# Patient Record
Sex: Female | Born: 1955 | Race: White | Hispanic: No | Marital: Single | Smoking: Never smoker
Health system: Southern US, Community
[De-identification: ages and names within clinical notes are randomized; demographics above are authoritative.]

## PROBLEM LIST (undated history)

## (undated) DIAGNOSIS — Z95810 Presence of automatic (implantable) cardiac defibrillator: Secondary | ICD-10-CM

## (undated) DIAGNOSIS — C801 Malignant (primary) neoplasm, unspecified: Secondary | ICD-10-CM

## (undated) DIAGNOSIS — I10 Essential (primary) hypertension: Secondary | ICD-10-CM

## (undated) DIAGNOSIS — T7840XA Allergy, unspecified, initial encounter: Secondary | ICD-10-CM

## (undated) DIAGNOSIS — C4491 Basal cell carcinoma of skin, unspecified: Secondary | ICD-10-CM

## (undated) DIAGNOSIS — I509 Heart failure, unspecified: Secondary | ICD-10-CM

## (undated) DIAGNOSIS — I429 Cardiomyopathy, unspecified: Secondary | ICD-10-CM

## (undated) HISTORY — DX: Cardiomyopathy, unspecified: I42.9

## (undated) HISTORY — DX: Essential (primary) hypertension: I10

## (undated) HISTORY — DX: Allergy, unspecified, initial encounter: T78.40XA

## (undated) HISTORY — PX: APPENDECTOMY: SHX54

## (undated) HISTORY — PX: BREAST BIOPSY: SHX20

## (undated) HISTORY — DX: Presence of automatic (implantable) cardiac defibrillator: Z95.810

## (undated) HISTORY — DX: Heart failure, unspecified: I50.9

---

## 1998-03-16 DIAGNOSIS — M629 Disorder of muscle, unspecified: Secondary | ICD-10-CM | POA: Insufficient documentation

## 2003-04-11 DIAGNOSIS — M810 Age-related osteoporosis without current pathological fracture: Secondary | ICD-10-CM | POA: Insufficient documentation

## 2006-10-29 ENCOUNTER — Other Ambulatory Visit: Payer: Self-pay

## 2006-10-29 ENCOUNTER — Emergency Department: Payer: Self-pay | Admitting: Emergency Medicine

## 2006-11-24 ENCOUNTER — Ambulatory Visit: Payer: Self-pay | Admitting: Cardiology

## 2006-12-15 ENCOUNTER — Ambulatory Visit: Payer: Self-pay | Admitting: Internal Medicine

## 2007-01-15 ENCOUNTER — Emergency Department: Payer: Self-pay | Admitting: Emergency Medicine

## 2007-01-25 ENCOUNTER — Ambulatory Visit: Payer: Self-pay | Admitting: Internal Medicine

## 2007-02-04 ENCOUNTER — Ambulatory Visit: Payer: Self-pay

## 2007-02-08 ENCOUNTER — Ambulatory Visit: Payer: Self-pay | Admitting: Internal Medicine

## 2007-02-08 ENCOUNTER — Observation Stay (HOSPITAL_COMMUNITY): Admission: AD | Admit: 2007-02-08 | Discharge: 2007-02-10 | Payer: Self-pay | Admitting: Internal Medicine

## 2007-02-08 ENCOUNTER — Ambulatory Visit: Payer: Self-pay | Admitting: Cardiology

## 2007-02-09 ENCOUNTER — Encounter: Payer: Self-pay | Admitting: Internal Medicine

## 2007-02-24 ENCOUNTER — Ambulatory Visit: Payer: Self-pay | Admitting: Internal Medicine

## 2007-06-16 ENCOUNTER — Ambulatory Visit: Payer: Self-pay | Admitting: Internal Medicine

## 2007-09-20 ENCOUNTER — Ambulatory Visit: Payer: Self-pay | Admitting: Internal Medicine

## 2008-03-05 ENCOUNTER — Ambulatory Visit: Payer: Self-pay | Admitting: Internal Medicine

## 2008-06-05 ENCOUNTER — Ambulatory Visit: Payer: Self-pay | Admitting: Internal Medicine

## 2008-06-12 ENCOUNTER — Encounter: Payer: Self-pay | Admitting: Internal Medicine

## 2008-09-04 ENCOUNTER — Ambulatory Visit: Payer: Self-pay | Admitting: Internal Medicine

## 2008-09-27 ENCOUNTER — Encounter: Payer: Self-pay | Admitting: Internal Medicine

## 2008-12-11 ENCOUNTER — Ambulatory Visit: Payer: Self-pay | Admitting: Internal Medicine

## 2009-01-02 DIAGNOSIS — I509 Heart failure, unspecified: Secondary | ICD-10-CM | POA: Insufficient documentation

## 2009-01-02 DIAGNOSIS — I429 Cardiomyopathy, unspecified: Secondary | ICD-10-CM | POA: Insufficient documentation

## 2009-01-03 ENCOUNTER — Encounter: Payer: Self-pay | Admitting: Internal Medicine

## 2009-03-18 ENCOUNTER — Ambulatory Visit: Payer: Self-pay | Admitting: Internal Medicine

## 2009-06-18 ENCOUNTER — Ambulatory Visit: Payer: Self-pay | Admitting: Internal Medicine

## 2009-07-03 ENCOUNTER — Encounter: Payer: Self-pay | Admitting: Internal Medicine

## 2009-09-19 ENCOUNTER — Ambulatory Visit: Payer: Self-pay | Admitting: Internal Medicine

## 2009-10-02 ENCOUNTER — Encounter: Payer: Self-pay | Admitting: Internal Medicine

## 2009-12-05 ENCOUNTER — Ambulatory Visit: Payer: Self-pay | Admitting: Gastroenterology

## 2009-12-09 LAB — PATHOLOGY REPORT

## 2009-12-19 ENCOUNTER — Ambulatory Visit: Payer: Self-pay | Admitting: Internal Medicine

## 2010-02-13 ENCOUNTER — Encounter (INDEPENDENT_AMBULATORY_CARE_PROVIDER_SITE_OTHER): Payer: Self-pay | Admitting: *Deleted

## 2010-03-06 ENCOUNTER — Telehealth (INDEPENDENT_AMBULATORY_CARE_PROVIDER_SITE_OTHER): Payer: Self-pay | Admitting: *Deleted

## 2010-04-15 NOTE — Cardiovascular Report (Signed)
Summary: Office Visit Remote  Office Visit Remote   Imported By: Roderic Ovens 07/03/2009 14:39:18  _____________________________________________________________________  External Attachment:    Type:   Image     Comment:   External Document

## 2010-04-15 NOTE — Letter (Signed)
Summary: Remote Device Check  [employer], Main Office  [address]   [place], [place] [address]   Phone: [phone]  Fax: [phone]     October 02, 2009 MRN: [id number]   [facility] [place] [address] [place], [place]  [address]   Dear Ms. Wenner,   Your remote transmission was recieved and reviewed by your physician.  All diagnostics were within normal limits for you.  __X___Your next transmission is scheduled for:   12-19-2009.  Please transmit at any time this day.  If you have a wireless device your transmission will be sent automatically.   Sincerely,  Vella Kohler

## 2010-04-15 NOTE — Assessment & Plan Note (Signed)
Summary: f1y   Visit Type:  Follow-up Primary Provider:  DR. Blythe Stanford  CC:  No complaints and some lightheadedness within the last week.  History of Present Illness: Vanessa Cohen is seen in followup for congestive heart failure in the setting of nonischemic cardiomyopathy he is status post CRT D. implantation.The patient denies SOB, chest pain, edema or palpitations   Current Medications (verified): 1)  Furosemide 20 Mg Tabs (Furosemide) .... Take One Tablet By Mouth Daily. 2)  Aspirin 81 Mg Tbec (Aspirin) .... Take One Tablet By Mouth Daily 3)  Ramipril 5 Mg Caps (Ramipril) .... Take One Capsule By Mouth Daily 4)  Carvedilol 25 Mg Tabs (Carvedilol) .... Take One Tablet By Mouth Twice A Day 5)  Zyrtec Hives Relief 10 Mg Tabs (Cetirizine Hcl) .Marland Kitchen.. 1 By Mouth Once Daily  Allergies (verified): No Known Drug Allergies  Vital Signs:  Patient profile:   55 year old female Height:      68 inches Weight:      162 pounds BMI:     24.72 Pulse rate:   62 / minute Pulse rhythm:   regular BP sitting:   128 / 98  (left arm) Cuff size:   regular  Vitals Entered By: Mercer Pod (March 18, 2009 10:05 AM)  Physical Exam  General:  The patient was alert and oriented in no acute distress. HEENT Normal.  Neck veins were flat, carotids were brisk.  Lungs were clear.  Heart sounds were regular without murmurs or gallops.  Abdomen was soft with active bowel sounds. There is no clubbing cyanosis or edema. Skin Warm and dry     ICD Specifications Following MD:  Sherryl Manges, MD     ICD Vendor:  Medtronic     ICD Model Number:  [id number]     ICD Serial Number:  [id number] ICD DOI:  02/08/2007     ICD Implanting MD:  Sherryl Manges, MD  Lead 1:    Location: RA     DOI: 02/08/2007     Model #: 1478     Serial #: [id number]     Status: active Lead 2:    Location: RV     DOI: 02/08/2007     Model #: 5784     Serial #: [id number]     Status: active Lead 3:    Location: LV     DOI:  02/08/2007     Model #: 1324     Serial #: [id number]     Status: active  Indications::  NICM, CHF   ICD Follow Up Remote Check?  No Battery Voltage:  3.10 V     Charge Time:  9.6 seconds     Underlying rhythm:  SR@63  ICD Dependent:  No       ICD Device Measurements Atrium:  Amplitude: 5.0 mV, Impedance: 520 ohms, Threshold: 0.5 V at 0.4 msec Right Ventricle:  Amplitude: 9.0 mV, Impedance: 376 ohms, Threshold: 0.5 V at 0.4 msec Left Ventricle:  Impedance: 384 ohms, Threshold: 0.5 V at 0.4 msec Shock Impedance: 48/59 ohms   Episodes MS Episodes:  15     Percent Mode Switch:  <0.1%     Coumadin:  No Shock:  0     ATP:  0     Nonsustained:  0     Atrial Pacing:  0.7%     Ventricular Pacing:  99.8%  Brady Parameters Mode DDD     Lower Rate Limit:  50  Upper Rate Limit 130 PAV 120     Sensed AV Delay:  120  Tachy Zones VF:  207     VT:  182     Next Remote Date:  06/18/2009     Next Cardiology Appt Due:  03/16/2010 Tech Comments:  No parameter changes.  Device function normal.   [facility] transmissions every 3 months.  ROV 1 year Dr. Herbert Moors. Altha Harm, LPN  March 18, 2009 10:15 AM   Impression & Recommendations:  Problem # 1:  CONGESTIVE HEART FAILURE, UNSPECIFIED (ICD-428.0) She is well compensated on her current medications. Based on the recent Aldactone trial, I think it would be reasonable to begin her on an aldosterone antagonist. I will defer this to her primary cardiologist I have given her a note Her updated medication list for this problem includes:    Furosemide 20 Mg Tabs (Furosemide) .Marland Kitchen... Take one tablet by mouth daily.    Aspirin 81 Mg Tbec (Aspirin) .Marland Kitchen... Take one tablet by mouth daily    Ramipril 5 Mg Caps (Ramipril) .Marland Kitchen... Take one capsule by mouth daily    Carvedilol 25 Mg Tabs (Carvedilol) .Marland Kitchen... Take one tablet by mouth twice a day  Problem # 2:  ICD - IN SITU (ICD-V45.02) CRT device is functioning normally there were brief episodes of atrial  fibrillation which is likely related far field oversensing  Problem # 3:  CARDIOMYOPATHY, SECONDARY (ICD-425.9) continue her current medications otherwise as above Her updated medication list for this problem includes:    Furosemide 20 Mg Tabs (Furosemide) .Marland Kitchen... Take one tablet by mouth daily.    Aspirin 81 Mg Tbec (Aspirin) .Marland Kitchen... Take one tablet by mouth daily    Ramipril 5 Mg Caps (Ramipril) .Marland Kitchen... Take one capsule by mouth daily    Carvedilol 25 Mg Tabs (Carvedilol) .Marland Kitchen... Take one tablet by mouth twice a day  Patient Instructions: 1)  Your physician recommends that you schedule a follow-up appointment in: 1 year

## 2010-04-15 NOTE — Cardiovascular Report (Signed)
Summary: Office Visit Remote   Office Visit Remote   Imported By: Roderic Ovens 10/03/2009 10:56:37  _____________________________________________________________________  External Attachment:    Type:   Image     Comment:   External Document

## 2010-04-15 NOTE — Letter (Signed)
Summary: Remote Device Check  [employer], Main Office  [address]   [place], [place] [address]   Phone: [phone]  Fax: [phone]     July 03, 2009 MRN: [id number]   [facility] [place] [address] [place], [place]  [address]   Dear Ms. Yeley,   Your remote transmission was recieved and reviewed by your physician.  All diagnostics were within normal limits for you.  __X___Your next transmission is scheduled for: September 19, 2009.  Please transmit at any time this day.  If you have a wireless device your transmission will be sent automatically.     Sincerely,  Proofreader

## 2010-04-15 NOTE — Letter (Signed)
Summary: Remote Device Check  [employer], Main Office  [address]   [place], [place] [address]   Phone: [phone]  Fax: [phone]     February 13, 2010 MRN: [id number]   [facility] [place] [address] [place], [place]  [address]   Dear Ms. Davtyan,   Your remote transmission was recieved and reviewed by your physician.  All diagnostics were within normal limits for you.  _____Your next transmission is scheduled for:                       .  Please transmit at any time this day.  If you have a wireless device your transmission will be sent automatically.  __X____Your next office visit is scheduled for: January 2012 with Dr. Graciela Husbands.                            Marland Kitchen Please call our office to schedule an appointment.    Sincerely,  Altha Harm, LPN

## 2010-04-15 NOTE — Miscellaneous (Signed)
Summary: med update  Clinical Lists Changes  Medications: Added new medication of FUROSEMIDE 20 MG TABS (FUROSEMIDE) Take one tablet by mouth daily. Added new medication of ASPIRIN 81 MG TBEC (ASPIRIN) Take one tablet by mouth daily Added new medication of RAMIPRIL 5 MG CAPS (RAMIPRIL) Take one capsule by mouth daily Added new medication of CARVEDILOL 25 MG TABS (CARVEDILOL) Take one tablet by mouth twice a day

## 2010-04-17 NOTE — Progress Notes (Signed)
Summary: Called pt  Phone Note Outgoing Call Call back at [facility] Phone [phone]   Call placed by: Harlon Flor,  March 06, 2010 11:57 AM Call placed to: Patient Summary of Call: [facility] TCB to schedule f/u with Graciela Husbands in January. Initial call taken by: Harlon Flor,  March 06, 2010 11:57 AM

## 2010-05-09 ENCOUNTER — Encounter (INDEPENDENT_AMBULATORY_CARE_PROVIDER_SITE_OTHER): Payer: BC Managed Care – PPO | Admitting: Internal Medicine

## 2010-05-09 ENCOUNTER — Encounter: Payer: Self-pay | Admitting: Internal Medicine

## 2010-05-09 DIAGNOSIS — I4891 Unspecified atrial fibrillation: Secondary | ICD-10-CM

## 2010-05-09 DIAGNOSIS — I472 Ventricular tachycardia: Secondary | ICD-10-CM

## 2010-05-09 DIAGNOSIS — I428 Other cardiomyopathies: Secondary | ICD-10-CM

## 2010-05-09 DIAGNOSIS — Z95810 Presence of automatic (implantable) cardiac defibrillator: Secondary | ICD-10-CM

## 2010-05-13 NOTE — Assessment & Plan Note (Signed)
Summary: F1Y/JLA   Visit Type:  Follow-up Primary Provider:  DR. Blythe Stanford  CC:  "Doing well.".  History of Present Illness: Vanessa Cohen is seen in followup for congestive heart failure in the setting of nonischemic cardiomyopathy he is status post CRT D. implantation.The patient denies SOB, chest pain, edema or palpitations  She is actually doing cardio workouts 3 or 4 times a week now. She is able to climb stairs. She had an echo done last week. These results are pending.  Her other major issue is intermittent orthostasis.  Current Medications (verified): 1)  Furosemide 20 Mg Tabs (Furosemide) .... Take One Tablet By Mouth Daily. 2)  Aspirin 81 Mg Tbec (Aspirin) .... Take One Tablet By Mouth Daily 3)  Ramipril 10 Mg Caps (Ramipril) .... One Tablet Once Daily 4)  Carvedilol 25 Mg Tabs (Carvedilol) .... Take One Tablet By Mouth Twice A Day 5)  Zyrtec Hives Relief 10 Mg Tabs (Cetirizine Hcl) .Marland Kitchen.. 1 By Mouth Once Daily 6)  Flonase 50 Mcg/act Susp (Fluticasone Propionate) .... As Needed 7)  Vitamin C 1000mg  .... Daily  Allergies (verified): No Known Drug Allergies  Past History:  Past Medical History: Last updated: 01/02/2009 CONGESTIVE HEART FAILURE, UNSPECIFIED (ICD-428.0) CARDIOMYOPATHY, SECONDARY (ICD-425.9) ICD - IN SITU (ICD-V45.02)    Family History: Last updated: 01/02/2009 Family History of CVA or Stroke:   Social History: Last updated: 01/02/2009 Full Time Divorced  Tobacco Use - No.  Alcohol Use - yes Regular Exercise - no Drug Use - no  Risk Factors: Exercise: no (01/02/2009)  Risk Factors: Smoking Status: never (01/02/2009)  Vital Signs:  Patient profile:   55 year old female Height:      68 inches Weight:      141 pounds BMI:     21.52 Pulse rate:   76 / minute BP sitting:   90 / 60  (left arm) Cuff size:   regular  Vitals Entered By: Bishop Dublin, CMA (May 09, 2010 10:49 AM)  Physical Exam  General:  The patient was alert  and oriented in no acute distress. HEENT Normal.  Neck veins were flat device pocket is well-healed Lungs were clear.  Heart sounds were regular without murmurs or gallops.  Abdomen was soft with active bowel sounds. There is no clubbing cyanosis or edema. Skin Warm and dry     ICD Specifications Following MD:  Sherryl Manges, MD     ICD Vendor:  Medtronic     ICD Model Number:  [phone]     ICD Serial Number:  [id number] ICD DOI:  02/08/2007     ICD Implanting MD:  Sherryl Manges, MD  Lead 1:    Location: RA     DOI: 02/08/2007     Model #: 1478     Serial #: [id number]     Status: active Lead 2:    Location: RV     DOI: 02/08/2007     Model #: 5784     Serial #: [id number]     Status: active Lead 3:    Location: LV     DOI: 02/08/2007     Model #: 1324     Serial #: [id number]     Status: active  Indications::  NICM, CHF   ICD Follow Up ICD Dependent:  No      Episodes Coumadin:  No  Brady Parameters Mode DDD     Lower Rate Limit:  50     Upper Rate Limit 130 PAV 120  Sensed AV Delay:  120  Tachy Zones VF:  207     VT:  182     Impression & Recommendations:  Problem # 1:  CONGESTIVE HEART FAILURE, UNSPECIFIED (ICD-428.0) her congestive symptoms are largely resolved. I await with eagerness the status of her left ventricular function by echo. She'll continue with CRT Her updated medication list for this problem includes:    Furosemide 20 Mg Tabs (Furosemide) .Marland Kitchen... Take one tablet by mouth daily.    Aspirin 81 Mg Tbec (Aspirin) .Marland Kitchen... Take one tablet by mouth daily    Ramipril 10 Mg Caps (Ramipril) ..... One tablet once daily    Carvedilol 25 Mg Tabs (Carvedilol) .Marland Kitchen... 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 This was about 8 seconds. In addition his other episodes by [facility], who make sure that her  detection intervals are sufficiently prolonged as to avoid premature therapy Her updated medication list for this problem includes:    Aspirin 81 Mg Tbec (Aspirin) .Marland Kitchen... Take one tablet by mouth daily    Ramipril 10 Mg Caps (Ramipril) ..... One tablet once daily    Carvedilol 25 Mg Tabs (Carvedilol) .Marland Kitchen... Take one tablet by mouth twice a day  Problem # 4:  ATRIAL FIBRILLATION-NONSUSTAINED (ICD-427.31) 10 she is nonsustained atrial arrhythmias; we will keep an eye on this. There is no indication at this point for anticoagulation Her updated medication list for this problem includes:    Aspirin 81 Mg Tbec (Aspirin) .Marland Kitchen... Take one tablet by mouth daily    Carvedilol 25 Mg Tabs (Carvedilol) .Marland Kitchen... Take one tablet by mouth twice a day  Problem # 5:  CARDIOMYOPATHY, SECONDARY (ICD-425.9) stable on current meds Her updated medication list for this problem includes:    Furosemide 20 Mg Tabs (Furosemide) .Marland Kitchen... Take one tablet by mouth daily.    Aspirin 81 Mg Tbec (Aspirin) .Marland Kitchen... Take one tablet by mouth daily    Ramipril 10 Mg Caps (Ramipril) ..... One tablet once daily    Carvedilol 25 Mg Tabs (Carvedilol) .Marland Kitchen... Take one tablet by mouth twice a day

## 2010-07-29 NOTE — Letter (Signed)
June 16, 2007    Dr. Trinna Post [facility]aschos  [address]  [place], [place] [address]   RE:  Vanessa Cohen, Vanessa Cohen  MRN:  [id number]  /  DOB:  05-Apr-1955   Dear Trinna Post:   Vanessa Cohen comes in today following CRT implantation in November  2008.  She has moved from class III to class II symptoms.  She has had a  little bit of diaphragmatic stimulation that is somewhat positional.   Otherwise, she is doing quite as noted.   MEDICATIONS:  1. Coreg now at 25 b.i.d.  2. Ramipril 5 a day.  3. Aspirin.  4. Furosemide 20.   EXAMINATION:  Her blood pressure is 142/70, which is a little bit high.  LUNGS:  Were clear.  HEART:  Sounds were regular.  EXTREMITIES:  Were without edema.   Interrogation of her Medtronic __________ device demonstrated a P wave  of 5.1, impedance of 600, threshold of 0.5 to 0.4.  The R wave was 6.6  with impedance of 416, and threshold of 1 volt to 0.4.  The LV impedance  was 316 with a threshold of 0.5 and 0.4.  Her A-V delay was programmed  at 140.  Her sensed A-V delay was 150 to 160, and we reprogrammed this  down to 120 with a dynamic A-V delay of 1.   IMPRESSION:  1. Ischemic cardiomyopathy.  2. Congestive heart failure __________ with borderline elevated blood      pressure.   Vanessa Cohen is doing very well.  We talked a lot about the longterm  prognosis associated with device therapy, and the improvements therein  associated with __________ therapy with doses of Coreg and Ramipril.   We will plan to continue her on these same medications.  I have advised  her to keep close track of her blood pressure, maybe she needs up  titration.   We will see her again in 9 months' time, and she will begin remote  followup in the interim.   Alex, I hope you and your family are doing well.    Sincerely,      Duke Salvia, MD, [facility]  Electronically Signed    SCK/MedQ  DD: 06/16/2007  DT: 06/16/2007  Job #: [id number]

## 2010-07-29 NOTE — Letter (Signed)
March 05, 2008    Dr. Trinna Post Paraschos  [address]  [place], [place] [address]   RE:  Vanessa Cohen, Vanessa Cohen  MRN:  [id number]  /  DOB:  1955/06/20   Dear Trinna Post:   I hope this letter finds you well and my wishes to you for a Merry  Christmas.  Hopefully Christmas will be very special to you this year.   [name] comes in.  She continues to do really quite well from a  breathing point of view.  Her functional capacity remains good.  She is  able to accomplish her activities of daily living.  She is having no  orthopnea, nocturnal dyspnea or peripheral edema.  She did have an  episode of chest discomfort that was unrelated to exertion.   Her medications are stable and include Coreg 25 b.i.d., furosemide 20,  aspirin 81 and ramipril 5.   EXAMINATION:  VITAL SIGNS:  Her blood pressure 126/78, pulse of 65,  weight was 152 which is up 9 pounds over the last 9 months.  NECK:  The veins were flat.  LUNGS:  Clear.  HEART:  Sounds were regular.  EXTREMITIES:  Without edema.  Her incision was a little bit erythematous  with a little small keloid.   Interrogation of her device demonstrated normal device function without  intercurrent episodes.  Her OptiVol index is flat.  Her atrial impedance  was 552.  Her ventricular impedance was 432.  LV impedance was 432.  Her  measured P-wave was 4.  She is 92% ventricularly paced and her AV delays  are 120 milliseconds.   Her recent echo from your office demonstrated ejection fraction of 40%  which is an improvement from 25% about a year ago.   IMPRESSION:  1. Nonischemic cardiomyopathy with intercurrent ejection fraction      improvement.  2. Congestive heart failure - class II.  3. Status post CRTD for #1 giving rise to #2.   [name] is doing really well, Alex.  Thanks for allowing [place] to  participate her care.  We will continue to follow her remotely and I  will see her again when it is time.    Sincerely,      Duke Salvia, MD, [facility]  Electronically Signed    SCK/MedQ  DD: 03/05/2008  DT: 03/05/2008  Job #: [id number]

## 2010-07-29 NOTE — Op Note (Signed)
NAMESEYMONE, Vanessa Cohen              ACCOUNT NO.:  [id number]   MEDICAL RECORD NO.:  [id number]          PATIENT TYPE:  INP   LOCATION:  3707                         FACILITY:  [facility]   PHYSICIAN:  Duke Salvia, MD, FACCDATE OF BIRTH:  20-Feb-1956   DATE OF PROCEDURE:  02/09/2007  DATE OF DISCHARGE:                               OPERATIVE REPORT   PREOPERATIVE DIAGNOSIS:  Diaphragmatic stimulation for microperforation  of the right ventricular defibrillator lead.   POSTOPERATIVE DIAGNOSIS:  Diaphragmatic stimulation for microperforation  of the right ventricular defibrillator lead.   PROCEDURE:  Repositioning of a previously implanted defibrillator lead.   DESCRIPTION OF PROCEDURE:  Following obtaining informed consent, the  patient was brought to the electrophysiology laboratory and placed on  the fluoroscopic table in a supine position.  After routine prep and  drape, lidocaine was infiltrated along the line of the previous  incision.  The incision was opened and carried down to layer of device  pocket.  Using sharp dissection the pocket was opened.  The sutures were  removed.  The device was explanted.  The right ventricular lead was  freed up.  A stylet was placed and the lead was then withdrawn about 2  or 3 cm and then placed on the inferior aspect of the septum.  In this  location the bipolar R wave was about 9 mV with a pacing threshold of  under 1 volt, impedance was about 500 ohms.   I do not have the rest of the parameters but the leads were then all  reattached to the previously implanted Concerto device.  Defibrillation  threshold testing was undertaken following hemostasis being obtained and  secured.  Numerous inductions were attempted to get sustained  ventricular fibrillation.  We finally did and a 20 joule shock was  delivered through a normal resistance, terminated ventricular  fibrillation restoring sinus rhythm.   The patient tolerated the procedure without  apparent complication.   The wound was then closed in 3 layers in normal fashion.  The wound was  washed, dried, and a benzoin Steri-Strip dressing was applied.      Duke Salvia, MD, [facility]  Electronically Signed     SCK/MEDQ  D:  02/09/2007  T:  02/10/2007  Job:  [id number]   cc:   Electrophysiology Laboratory  [facility]  Marcina Millard, MD  ___________ [facility]

## 2010-07-29 NOTE — Letter (Signed)
January 25, 2007    Vanessa Cohen, M.D.  [address]  [place], [place]  [address]   RE:  Vanessa Cohen, Vanessa Cohen  MRN:  [id number]  /  DOB:  05-11-1955   Dear Eustace Pen comes in today following her repeat echo, which demonstrates  persistent severe LV dysfunction.  She is also having more complaints of  fatigue and exercise intolerance.  This has improved with slight further  up-titration of her medication.  Limited by systolic blood pressures of  100 or so.  Her pulse is 68.  Her heart sounds were regular.   She has left bundle branch block and now probable class III congestive  heart failure.   I have suggested that we proceed with ICD implantation at this point and  attach the LV lead placement.  She is agreeable to this.  We have  discussed the potential benefits as well as the potential risks, which  include but are not limited to perforation, infection, lead placement  and device malfunction.  She would like to go ahead and proceed.  We  will plan to schedule this at her convenience.   Given the fact that we have elected to put an LV lead in, we will plan  to do it at [facility].   Thanks very much for allowing [place] to participate in her care.    Sincerely,      Duke Salvia, MD, [facility]  Electronically Signed    SCK/MedQ  DD: 01/25/2007  DT: 01/25/2007  Job #: [id number]

## 2010-07-29 NOTE — Letter (Signed)
December 15, 2006    Marcina Millard, M.D.  [address]  [place], [place] [address]   RE:  Vanessa, Cohen  MRN:  [id number]  /  DOB:  17-Jan-1956   Dear Trinna Post:   It was a pleasure talking to you again today.  I hope you and your  family are well.   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  As you recall, she presented in August with a 56-month history of  nocturnal dyspnea, increasing exercise intolerance, dyspnea on exertion  and in fact dyspnea on rest.  Initially, she was felt to have GE reflux  disease but it became apparent that she was manifesting severe  congestive heart failure.  Under your guise, medical therapy was  instituted and she has done much, much better.  She is now able to walk,  she is now able to carry her groceries.  She has a little bit of  nocturnal dyspnea but has no orthopnea and has had no peripheral edema.   She does have palpitations.  These have been both irregular and regular.  There has been no presyncope or syncope associated with these.   Her evaluations included an echo demonstrating an EF of 20-25%, a  catheterization demonstrating no obstructive disease and the  electrocardiogram that has demonstrated left bundle branch block.   Her related medical history is notable for high blood pressure for the  last couple of years.  There is no family history of cardiomyopathy.   PAST MEDICAL HISTORY:  In addition to the above, is notable for:  1. SEASONAL ALLERGIES.  2. Basal cell carcinoma, status post resection of a nose lesion.  3. She has had no other surgery.   SOCIAL HISTORY:  1. She is divorced.  2. She has 2 children.  3. She works as a Sales executive at [employer].  4. She does not use cigarettes, recreational drugs.  5. She does drink alcohol occasionally.  6. She drinks some amount of caffeine.   CURRENT MEDICATIONS:  1. Carvedilol 3.125.  2. Ramipril 2.5.  3. Furosemide 20.  4. Aspirin.   EXAMINATION:  Her blood pressure is 110/82, her pulse was 68, her weight  was 135 and she is 5 feet 7 inches.  HEENT EXAM:  Demonstrated no icterus or xanthoma.  NECK VEINS:  Flat actually, between 5 and 6 cm.  CAROTIDS:  Brisk and full bilaterally without bruits.  BACK:  Without kyphosis, scoliosis.  LUNGS:  Clear.  HEART SOUNDS:  Regular with a dyskinetic and sustained PMI.  There was  an early systolic murmur.  No S3 was appreciated.  ABDOMEN:  Soft with active bowel sounds.  There was midline pulsation  but it was not broad.  There is no hepatomegaly.  Femoral pulses were 2+, distal pulses were intact.  There is no  clubbing, cyanosis or edema.  NEUROLOGICAL EXAM:  Grossly normal.  SKIN:  Warm and dry.   Electrocardiogram dated today demonstrated sinus rhythm with left bundle  branch block with intervals of 0.19/0.16/0.47.  The axis was leftward at  -55.   IMPRESSION:  1. Nonischemic cardiomyopathy with an ejection fraction of 20 to 30%.  2. Class II congestive heart failure.  3. Left bundle branch block.  4. Irregular palpitations, question cause.   Vanessa Cohen, Vanessa Cohen, under your direction has been doing much better  since her initial diagnosis about a month ago.  The hope would be that  she would have significant  resolution of her cardiomyopathy, although  the fact that her symptoms have persisted for a couple of months makes  me a little bit less sanguine about that.  I agree with you that in the  event that it does not, ICD implantation would be appropriate and  quantitation of her functional capacity perhaps with CPX would be  helpful in trying to understand whether left ventricular lead cardiac  resynchronization would be appropriate at that time.   I reviewed this with her.   As we talked about on the telephone, I have increased her Coreg to 6.25  mg twice daily.  She is to see you at the end of October and your office  will undertake an echo at that time.  In the  event that LV dysfunction  persists, we will plan to proceed with ICD implantation and based on  your assessment with/without CPX direction as to whether you think she  might benefit from an LV lead.   Alex, again, thanks very much for asking [place] to see her.    Sincerely,      Duke Salvia, MD, [facility]  Electronically Signed    SCK/MedQ  DD: 12/15/2006  DT: 12/16/2006  Job #: [id number]

## 2010-07-29 NOTE — Op Note (Signed)
NAMENARELY, NOBLES              ACCOUNT NO.:  [id number]   MEDICAL RECORD NO.:  [id number]          PATIENT TYPE:  INP   LOCATION:  2899                         FACILITY:  [facility]   PHYSICIAN:  Duke Salvia, MD, FACCDATE OF BIRTH:  05-30-1955   DATE OF PROCEDURE:  02/08/2007  DATE OF DISCHARGE:                               OPERATIVE REPORT   PREOPERATIVE DIAGNOSIS:  Nonischemic cardiomyopathy, class III  congestive heart failure, left bundle branch block.   POSTOPERATIVE DIAGNOSIS:  Nonischemic cardiomyopathy, class III  congestive heart failure, left bundle branch block.   PROCEDURE:  Dual-chamber defibrillator implantation with intraoperative  defibrillation threshold testing.   DESCRIPTION OF PROCEDURE:  After obtaining informed consent, the patient  was brought to the Electrophysiology Laboratory and placed on the  fluoroscopic table in supine position.  After routine prep and drape of  the left upper chest, lidocaine was infiltrated in the prepectoral and  subclavicular region.  Incision was made and carried down to the layer  of the preperitoneal fascia, using electrocautery and sharp dissection.   Thereafter, attention was turned in gaining access to the thoracic, left  subclavian vein, which was accomplished without difficulty and without  the aspiration of air or puncture of the artery.  Three superior vena  punctures was accomplished.  Guidewire was placed and retained and a 0-  silk suture was placed around the more cephalad wires and allowed to  hang loosely.   Sequentially, a 9.5, 9-French and 7-French sheaths were placed, which  were passed a Medtronic 6947, 65 cm dual-coil defibrillator leads,  serial number [id number] and Medtronic MV2 coronary sinus cannulation  catheter, and a Medtronic 5076, 52 cm active excitation atrial lead,  serial number [id number].  Under fluoroscopic guidance, the ventricular  was manipulated to the right ventricular apex with a  bipolar R-wave of  16.3 with a pacing impedance of 844 ohms.  The threshold at 0.8 volts at  0.5 milliseconds.  Current threshold was 1.0.  There was no  diaphragmatic pacing at 10 volts and the current of injury was brisk.  This lead was secured to the prepectoral fascia.  Through the coronary  sinus cannulation MV2, the CS was cannulated to difficulty.  Venography  demonstrated a single high lateral branch, which was then targeted  because of its size with a whisper wire in conjunction with a 4193  Medtronic unipolar LV lead serial number [id number].  It was manipulated  to a midpoint between base and apex, where the bipolar L wave was 19.7  with a pace impedance of 590 ohms, a threshold of 0.4 V at 0.5  milliseconds.  Current threshold is 0.8 mA.  There is no diaphragmatic  pacing at 10 V.  The 9.5 French sheath was removed.  The MV2 was left  behind and the atrial lead was then inserted and manipulated to the  right atrial appendage under fluoroscopy with a P wave of 3.7, impedance  of 1286 and threshold 1.9 V at 0.5 milliseconds.  Current threshold was  1.8 mA.  There is no diaphragmatic pacing at 10 V.  The current  of  injury was huge.  This lead was secured to the prepectoral fascia and  the delivery system for the LV lead was removed.  Parameters confirmed  and the lead was secured.   These leads were then all attached to a Medtronic Concerto [id number].  ICD  was [id number].  Through the device, the bipolar P wave is 4.9 with a  pace impendence of 1120 ohms, threshold of 1.5 V at 0.5 milliseconds.  R  wave was 10.5 with impedance of 660, a threshold of 0.5 V at 0.5  milliseconds, and the LV impedance was 488 with a threshold of 0.5 V at  0.5.   With these acceptable parameters, recorded comments of defibrillation  threshold testing was undertaken.  Ventricular fibrillation was induced  via the T wave shock.  After total duration of 6.5 seconds, a 15-joule  shock was delivered  through a measured resistance of 30 ohms, terminated  ventricular fibrillation, restoring in an AV paced rhythm.  I should  note that we attempted induction of ventricular fibrillation on 4 other  occasions, 3 with T wave shock and 1 with 50 Hz pacing, none of which  resolved in sustained ventricular fibrillation.   I should also note that the proximal coil impedance was 48 ohms and the  distal coil impedance was 41 ohms.   The device was then implanted.  The pocket was copiously irrigated with  antibiotic-containing saline solution.  Hemostasis was assured and the  leads in the pulse generator and secured to the prepectoral fascia.  The  wound was closed in 2 layers in the normal fashion.  A benzoin and Steri-  Strip dressing was applied.  Needle counts, sponge counts, and  instrument counts were correct at the end of the procedure, according to  the staff.  The patient tolerated the procedure without apparent  complication.      Duke Salvia, MD, [facility]  Electronically Signed     SCK/MEDQ  D:  02/08/2007  T:  02/09/2007  Job:  [id number]   cc:   Electrophysiology Laboratory  [facility]  [facility]

## 2010-08-07 ENCOUNTER — Other Ambulatory Visit: Payer: Self-pay

## 2010-08-07 ENCOUNTER — Ambulatory Visit (INDEPENDENT_AMBULATORY_CARE_PROVIDER_SITE_OTHER): Payer: BC Managed Care – PPO | Admitting: *Deleted

## 2010-08-07 DIAGNOSIS — I509 Heart failure, unspecified: Secondary | ICD-10-CM

## 2010-08-07 DIAGNOSIS — I429 Cardiomyopathy, unspecified: Secondary | ICD-10-CM

## 2010-08-29 ENCOUNTER — Encounter: Payer: Self-pay | Admitting: Internal Medicine

## 2010-09-18 NOTE — Progress Notes (Signed)
ICD remote with ICM 

## 2010-11-05 ENCOUNTER — Other Ambulatory Visit: Payer: Self-pay | Admitting: Internal Medicine

## 2010-11-05 ENCOUNTER — Encounter: Payer: Self-pay | Admitting: Internal Medicine

## 2010-11-06 ENCOUNTER — Ambulatory Visit (INDEPENDENT_AMBULATORY_CARE_PROVIDER_SITE_OTHER): Payer: BC Managed Care – PPO | Admitting: *Deleted

## 2010-11-06 DIAGNOSIS — I429 Cardiomyopathy, unspecified: Secondary | ICD-10-CM

## 2010-11-06 DIAGNOSIS — I509 Heart failure, unspecified: Secondary | ICD-10-CM

## 2010-11-06 DIAGNOSIS — I428 Other cardiomyopathies: Secondary | ICD-10-CM

## 2010-11-10 LAB — REMOTE ICD DEVICE
ATRIAL PACING ICD: 1.46 pct
BAMS-0001: 170 {beats}/min
LV LEAD IMPEDENCE ICD: 400 Ohm
PACEART VT: 0
TOT-0006: 20081125000000
TZAT-0001ATACH: 2
TZAT-0001ATACH: 3
TZAT-0002ATACH: NEGATIVE
TZAT-0005SLOWVT: 88 pct
TZAT-0012ATACH: 150 ms
TZAT-0012ATACH: 150 ms
TZAT-0012FASTVT: 200 ms
TZAT-0018ATACH: NEGATIVE
TZAT-0018FASTVT: NEGATIVE
TZAT-0019ATACH: 6 V
TZAT-0019ATACH: 6 V
TZAT-0019FASTVT: 8 V
TZAT-0020ATACH: 1.5 ms
TZAT-0020ATACH: 1.5 ms
TZAT-0020SLOWVT: 1.5 ms
TZON-0003ATACH: 350 ms
TZON-0003SLOWVT: 330 ms
TZON-0003VSLOWVT: 400 ms
TZON-0004VSLOWVT: 32
TZST-0001ATACH: 4
TZST-0001FASTVT: 6
TZST-0001SLOWVT: 2
TZST-0001SLOWVT: 3
TZST-0001SLOWVT: 4
TZST-0001SLOWVT: 5
TZST-0001SLOWVT: 6
TZST-0002ATACH: NEGATIVE
TZST-0002FASTVT: NEGATIVE
TZST-0002FASTVT: NEGATIVE
TZST-0002FASTVT: NEGATIVE
TZST-0002FASTVT: NEGATIVE
TZST-0003SLOWVT: 30 J
TZST-0003SLOWVT: 35 J
TZST-0003SLOWVT: 35 J
TZST-0003SLOWVT: 35 J
VF: 0

## 2010-11-12 ENCOUNTER — Encounter: Payer: Self-pay | Admitting: *Deleted

## 2010-11-24 NOTE — Progress Notes (Signed)
ICD/ICM checked remotely

## 2011-02-12 ENCOUNTER — Other Ambulatory Visit: Payer: Self-pay | Admitting: Internal Medicine

## 2011-02-12 ENCOUNTER — Encounter: Payer: Self-pay | Admitting: Internal Medicine

## 2011-02-12 ENCOUNTER — Ambulatory Visit (INDEPENDENT_AMBULATORY_CARE_PROVIDER_SITE_OTHER): Payer: BC Managed Care – PPO | Admitting: *Deleted

## 2011-02-12 DIAGNOSIS — I509 Heart failure, unspecified: Secondary | ICD-10-CM

## 2011-02-12 DIAGNOSIS — I429 Cardiomyopathy, unspecified: Secondary | ICD-10-CM

## 2011-02-13 LAB — REMOTE ICD DEVICE
AL IMPEDENCE ICD: 488 Ohm
ATRIAL PACING ICD: 0.38 pct
BAMS-0001: 170 {beats}/min
BRDY-0002LV: 50 {beats}/min
BRDY-0003LV: 130 {beats}/min
LV LEAD IMPEDENCE ICD: 376 Ohm
LV LEAD THRESHOLD: 1 V
RV LEAD IMPEDENCE ICD: 376 Ohm
TZAT-0001ATACH: 2
TZAT-0001ATACH: 3
TZAT-0001FASTVT: 1
TZAT-0002ATACH: NEGATIVE
TZAT-0002ATACH: NEGATIVE
TZAT-0002FASTVT: NEGATIVE
TZAT-0012FASTVT: 200 ms
TZAT-0013SLOWVT: 4
TZAT-0018ATACH: NEGATIVE
TZAT-0018ATACH: NEGATIVE
TZAT-0018SLOWVT: NEGATIVE
TZAT-0019ATACH: 6 V
TZAT-0019ATACH: 6 V
TZAT-0019ATACH: 6 V
TZAT-0019SLOWVT: 8 V
TZAT-0020ATACH: 1.5 ms
TZAT-0020ATACH: 1.5 ms
TZAT-0020FASTVT: 1.5 ms
TZAT-0020SLOWVT: 1.5 ms
TZON-0003SLOWVT: 330 ms
TZON-0003VSLOWVT: 400 ms
TZST-0001ATACH: 6
TZST-0001FASTVT: 3
TZST-0001FASTVT: 4
TZST-0001FASTVT: 6
TZST-0001SLOWVT: 2
TZST-0001SLOWVT: 3
TZST-0001SLOWVT: 6
TZST-0002FASTVT: NEGATIVE
TZST-0002FASTVT: NEGATIVE
TZST-0002FASTVT: NEGATIVE
TZST-0003SLOWVT: 30 J
TZST-0003SLOWVT: 35 J
TZST-0003SLOWVT: 35 J
TZST-0003SLOWVT: 35 J
VENTRICULAR PACING ICD: 99.84 pct

## 2011-02-17 NOTE — Progress Notes (Signed)
Remote icd check with icm  

## 2011-02-19 ENCOUNTER — Encounter: Payer: Self-pay | Admitting: *Deleted

## 2011-05-15 ENCOUNTER — Encounter: Payer: Self-pay | Admitting: *Deleted

## 2011-07-02 ENCOUNTER — Encounter: Payer: BC Managed Care – PPO | Admitting: Internal Medicine

## 2011-11-05 ENCOUNTER — Telehealth: Payer: Self-pay | Admitting: Internal Medicine

## 2011-11-05 ENCOUNTER — Encounter: Payer: Self-pay | Admitting: Internal Medicine

## 2011-11-05 NOTE — Telephone Encounter (Signed)
11-05-11 home number d/c, work number no longer there, sent past due pacer ck with klein/mt

## 2011-11-12 NOTE — Telephone Encounter (Signed)
F/u   Pt called to inform us she has est w/ Dr. Jacelyn Pi as her Cardiologist for defib care.  She will not be returning to [facility]

## 2012-08-26 DIAGNOSIS — I5022 Chronic systolic (congestive) heart failure: Secondary | ICD-10-CM | POA: Insufficient documentation

## 2012-08-26 DIAGNOSIS — I10 Essential (primary) hypertension: Secondary | ICD-10-CM | POA: Insufficient documentation

## 2013-01-26 DIAGNOSIS — Z950 Presence of cardiac pacemaker: Secondary | ICD-10-CM | POA: Insufficient documentation

## 2014-09-03 ENCOUNTER — Telehealth: Payer: Self-pay

## 2014-09-03 NOTE — Telephone Encounter (Signed)
Patient called this morning at 9:58 AM c/o left eye pain, redness and discharge. Patient reports that she ran into a spider web yesterday while mowing her yard. Patient reports that she felt "a tiny spider sting her in her left eye". Patient reports that she rinsed out her eye yesterday with water and again this morning. Patient reports that her eye is still "stinging". I advised pt to call [facility] and try to get an appointment with them today if she was not able to get in today to see an opthalmologic to calls [place] back and maybe have a provider look at her eye here.  Patient agreed with plan and reported that she would call [facility] today. Patient's CB# [phone]. Thank you SD

## 2014-09-04 NOTE — Telephone Encounter (Signed)
F/U call pt reports that she will be seen [facility] today. Patient reports that her eye is better this morning. I did encourage pt to keep appointment with [place]. sd

## 2014-12-10 ENCOUNTER — Other Ambulatory Visit: Payer: Self-pay | Admitting: Family Medicine

## 2014-12-10 DIAGNOSIS — J309 Allergic rhinitis, unspecified: Secondary | ICD-10-CM

## 2014-12-10 NOTE — Telephone Encounter (Signed)
Last OV 03/2014  Thanks,   -Laura  

## 2015-06-07 ENCOUNTER — Other Ambulatory Visit: Payer: Self-pay

## 2015-06-07 DIAGNOSIS — J309 Allergic rhinitis, unspecified: Secondary | ICD-10-CM

## 2015-06-07 MED ORDER — FLUTICASONE PROPIONATE 50 MCG/ACT NA SUSP: NASAL | Status: DC

## 2015-06-13 ENCOUNTER — Other Ambulatory Visit: Payer: Self-pay

## 2015-06-14 ENCOUNTER — Other Ambulatory Visit: Payer: Self-pay

## 2015-06-14 DIAGNOSIS — J309 Allergic rhinitis, unspecified: Secondary | ICD-10-CM

## 2015-06-17 ENCOUNTER — Other Ambulatory Visit: Payer: Self-pay | Admitting: Family Medicine

## 2015-06-17 DIAGNOSIS — J309 Allergic rhinitis, unspecified: Secondary | ICD-10-CM

## 2015-06-17 MED ORDER — FLUTICASONE PROPIONATE 50 MCG/ACT NA SUSP: NASAL | Status: DC

## 2015-06-17 NOTE — Telephone Encounter (Signed)
Pt contacted office for refill request on the following medications:  fluticasone (FLONASE) 50 MCG/ACT nasal spray.  [facility].  Request this resent for a 90 day supply/MW

## 2015-08-05 ENCOUNTER — Other Ambulatory Visit: Payer: Self-pay

## 2015-08-05 ENCOUNTER — Telehealth: Payer: Self-pay | Admitting: Family Medicine

## 2015-08-05 MED ORDER — TRIAMCINOLONE ACETONIDE 0.1 % EX CREA: 1.0000 "application " | TOPICAL_CREAM | Freq: Two times a day (BID) | CUTANEOUS | Status: DC

## 2015-08-05 NOTE — Telephone Encounter (Signed)
Pt contacted office for refill request on the following medications:  Triamcinolone Acetonide 1% cream.  [facility].  OD:[id number]

## 2015-08-05 NOTE — Telephone Encounter (Signed)
Can call in rx. Please make sure has follow up ov. Thanks.

## 2015-08-05 NOTE — Telephone Encounter (Signed)
Patient states you have given her this because of the extreme reaction to insect bites.

## 2015-10-24 ENCOUNTER — Encounter: Payer: Self-pay | Admitting: Physician Assistant

## 2015-11-25 ENCOUNTER — Ambulatory Visit (INDEPENDENT_AMBULATORY_CARE_PROVIDER_SITE_OTHER): Payer: BC Managed Care – PPO | Admitting: Physician Assistant

## 2015-11-25 ENCOUNTER — Encounter: Payer: Self-pay | Admitting: Physician Assistant

## 2015-11-25 VITALS — BP 104/60 | HR 70 | Temp 98.4°F | Ht 67.5 in | Wt 153.0 lb

## 2015-11-25 DIAGNOSIS — Z124 Encounter for screening for malignant neoplasm of cervix: Secondary | ICD-10-CM

## 2015-11-25 DIAGNOSIS — Z1159 Encounter for screening for other viral diseases: Secondary | ICD-10-CM | POA: Diagnosis not present

## 2015-11-25 DIAGNOSIS — Z1239 Encounter for other screening for malignant neoplasm of breast: Secondary | ICD-10-CM

## 2015-11-25 DIAGNOSIS — E7800 Pure hypercholesterolemia, unspecified: Secondary | ICD-10-CM

## 2015-11-25 DIAGNOSIS — Z0000 Encounter for general adult medical examination without abnormal findings: Secondary | ICD-10-CM

## 2015-11-25 DIAGNOSIS — S20212A Contusion of left front wall of thorax, initial encounter: Secondary | ICD-10-CM

## 2015-11-25 DIAGNOSIS — Z833 Family history of diabetes mellitus: Secondary | ICD-10-CM | POA: Diagnosis not present

## 2015-11-25 DIAGNOSIS — Z1211 Encounter for screening for malignant neoplasm of colon: Secondary | ICD-10-CM | POA: Diagnosis not present

## 2015-11-25 DIAGNOSIS — M170 Bilateral primary osteoarthritis of knee: Secondary | ICD-10-CM | POA: Insufficient documentation

## 2015-11-25 NOTE — Progress Notes (Addendum)
Patient: Vanessa Cohen, Female    DOB: [birth year], 60 y.o.   MRN: [id number] Visit Date: 11/25/2015  Today's Provider: Mar Daring, PA-C   Chief Complaint  Patient presents with  . Annual Exam   Subjective:    Annual physical exam Vanessa Cohen is a 60 y.o. female who presents today for health maintenance and complete physical. She feels fairly well. She reports exercising about every day. She reports she is sleeping well.  Td-03/27/2005 PPVS23-04/08/2013 Mammogram-05/30/2014 BI-RADS 2: Benign Colonoscopy: 12/05/2009- one polyp; 10-yr follow up  Patient also mentions that she has a pulled muscle on her left side for 7 days. She was reaching over a bannister and trying to reach something with her right hand. She couldn't reach it so she pushed forward a little more and felt like the rib moved. She did not hear a pop or crack. Patient reports that she has been taking Ibuprofen with mild relief. She is not taking Ibuprofen regularly, only when it hurts bad. Denies SOB or difficulty breathing.  Review of Systems  Constitutional: Negative.   HENT: Negative.   Eyes: Negative.   Respiratory: Negative.   Cardiovascular: Negative.   Gastrointestinal: Negative.   Endocrine: Negative.   Genitourinary: Negative.   Musculoskeletal: Negative.   Skin: Negative.   Allergic/Immunologic: Negative.   Neurological: Negative.   Hematological: Negative.   Psychiatric/Behavioral: Negative.     Social History      She  reports that she has never smoked. She has never used smokeless tobacco. She reports that she drinks alcohol. She reports that she does not use drugs.       Social History   Social History  . Marital status: Single    Spouse name: N/A  . Number of children: N/A  . Years of education: N/A   Social History Main Topics  . Smoking status: Never Smoker  . Smokeless tobacco: Never Used  . Alcohol use Yes  . Drug use: No  . Sexual activity: Not Asked    Other Topics Concern  . None   Social History Narrative   Full time. Does not regularly exercise.     Past Medical History:  Diagnosis Date  . Automatic implantable cardiac defibrillator in situ   . Congestive heart failure, unspecified   . Secondary cardiomyopathy, unspecified      Patient Active Problem List   Diagnosis Date Noted  . Primary osteoarthritis of both knees 11/25/2015  . Hypercholesteremia 11/25/2015  . Cardiac pacemaker in situ 01/26/2013  . Hypertension 08/26/2012  . Chronic systolic CHF (congestive heart failure) ([place]) 08/26/2012  . Allergic rhinitis 05/24/2009  . CARDIOMYOPATHY, SECONDARY 01/02/2009  . CONGESTIVE HEART FAILURE, UNSPECIFIED 01/02/2009  . H/O malignant neoplasm of skin 03/16/2006  . Osteopenia 04/11/2003  . Disorder of skeletal muscle 03/16/1998    History reviewed. No pertinent surgical history.  Family History        Family Status  Relation Status  . Other   . Mother Deceased  . Father Deceased  . Sister Alive  . Brother Alive  . Sister Alive  . Brother Alive        Her family history includes Cancer in her father; Heart disease in her mother; Stroke in her other.    Allergies  Allergen Reactions  . Sulfa Antibiotics     Current Meds  Medication Sig  . Ascorbic Acid (VITAMIN C) 1000 MG tablet Take 1,000 mg by mouth daily.    Marland Kitchen aspirin  81 MG tablet Take 81 mg by mouth daily.    . carvedilol (COREG) 25 MG tablet Take 25 mg by mouth 2 (two) times daily.    . cetirizine (ZYRTEC HIVES RELIEF) 10 MG tablet Take 10 mg by mouth daily.    . fluticasone (FLONASE) 50 MCG/ACT nasal spray SPRAY 2 SPRAY IN EACH NOSRTIL DAILY  Pt needs to schedule an office visit before anymore refills.  . ramipril (ALTACE) 10 MG capsule Take 10 mg by mouth daily.    Marland Kitchen triamcinolone cream (KENALOG) 0.1 % Apply 1 application topically 2 (two) times daily.    Patient Care Team: Mar Daring, PA-C as PCP - General (Family Medicine)      Objective:   Vitals: BP 104/60 (BP Location: Left Arm, Patient Position: Sitting, Cuff Size: Normal)   Pulse 70   Temp 98.4 F (36.9 C)   Ht 5' 7.5" (1.715 m)   Wt 153 lb (69.4 kg)   BMI 23.61 kg/m    Physical Exam  Constitutional: She is oriented to person, place, and time. She appears well-developed and well-nourished. No distress.  HENT:  Head: Normocephalic and atraumatic.  Right Ear: Hearing, tympanic membrane, external ear and ear canal normal.  Left Ear: Hearing, tympanic membrane, external ear and ear canal normal.  Nose: Nose normal.  Mouth/Throat: Uvula is midline, oropharynx is clear and moist and mucous membranes are normal. No oropharyngeal exudate.  Eyes: Conjunctivae and EOM are normal. Pupils are equal, round, and reactive to light. Right eye exhibits no discharge. Left eye exhibits no discharge. No scleral icterus.  Neck: Normal range of motion. Neck supple. No JVD present. Carotid bruit is not present. No tracheal deviation present. No thyromegaly present.  Cardiovascular: Normal rate, regular rhythm, normal heart sounds and intact distal pulses.  Exam reveals no gallop and no friction rub.   No murmur heard. Pulmonary/Chest: Effort normal and breath sounds normal. No respiratory distress. She has no wheezes. She has no rales. She exhibits no tenderness. Right breast exhibits no inverted nipple, no mass, no nipple discharge, no skin change and no tenderness. Left breast exhibits no inverted nipple, no mass, no nipple discharge, no skin change and no tenderness. Breasts are symmetrical.    Abdominal: Soft. Bowel sounds are normal. She exhibits no distension and no mass. There is no tenderness. There is no rebound and no guarding. Hernia confirmed negative in the right inguinal area and confirmed negative in the left inguinal area.  Genitourinary: Rectum normal, vagina normal and uterus normal. Rectal exam shows no external hemorrhoid, no internal hemorrhoid, no  fissure, no mass and guaiac negative stool. No breast swelling, tenderness, discharge or bleeding. Pelvic exam was performed with patient supine. There is no rash, tenderness, lesion or injury on the right labia. There is no rash, tenderness, lesion or injury on the left labia. Cervix exhibits no motion tenderness, no discharge and no friability. Right adnexum displays no mass, no tenderness and no fullness. Left adnexum displays no mass, no tenderness and no fullness. No erythema, tenderness or bleeding in the vagina. No signs of injury around the vagina. No vaginal discharge found.  Musculoskeletal: Normal range of motion. She exhibits no edema or tenderness.  Lymphadenopathy:    She has no cervical adenopathy.       Right: No inguinal adenopathy present.       Left: No inguinal adenopathy present.  Neurological: She is alert and oriented to person, place, and time. She has normal reflexes. No cranial  nerve deficit. Coordination normal.  Skin: Skin is warm and dry. No rash noted. She is not diaphoretic.  Psychiatric: She has a normal mood and affect. Her behavior is normal. Judgment and thought content normal.  Vitals reviewed.   Depression Screen No flowsheet data found.    Assessment & Plan:     Routine Health Maintenance and Physical Exam  Exercise Activities and Dietary recommendations Goals    None       There is no immunization history on file for this patient.  Health Maintenance  Topic Date Due  . Hepatitis C Screening  [birth year]  . HIV Screening  09/02/1971  . TETANUS/TDAP  09/02/1975  . PAP SMEAR  09/01/1977  . MAMMOGRAM  09/02/2006  . COLONOSCOPY  09/02/2006  . INFLUENZA VACCINE  10/15/2015      Discussed health benefits of physical activity, and encouraged her to engage in regular exercise appropriate for her age and condition.  1. Annual physical exam Normal physical exam today. Will check labs as below and f/u pending lab results. If labs are stable and  WNL she will not need to have these rechecked for one year at her next annual physical exam. She is to call the office in the meantime if she has any acute issue, questions or concerns. - CBC with Differential - Comprehensive metabolic panel - TSH  2. Breast cancer screening Breast exam today was normal. There is no family history of breast cancer. She does perform regular self breast exams. Mammogram was ordered as below. Information for [facility] was given to patient so she may schedule her mammogram at her convenience. - Mammogram Digital Screening; Future  3. Cervical cancer screening Pap collected today. Will send as below and f/u pending results. - Pap IG and HPV (high risk) DNA detection ([facility])  4. Colon cancer screening OC light was negative in office today.  5. Hypercholesteremia Will check labs as below and f/u pending results. - Lipid panel  6. Need for hepatitis C screening test - Hepatitis C antibody screen  7. Family history of diabetes mellitus (DM) Will check labs as below and f/u pending results. - Hemoglobin A1c  8. Rib contusion, left, initial encounter Advised to continue IBU and moist heat to the area. Practice deep breathing exercises. Call if no improvement. --------------------------------------------------------------------    Mar Daring, PA-C  [facility]

## 2015-11-25 NOTE — Patient Instructions (Signed)
Health Maintenance, Female Adopting a healthy lifestyle and getting preventive care can go a long way to promote health and wellness. Talk with your health care provider about what schedule of regular examinations is right for you. This is a good chance for you to check in with your provider about disease prevention and staying healthy. In between checkups, there are plenty of things you can do on your own. Experts have done a lot of research about which lifestyle changes and preventive measures are most likely to keep you healthy. Ask your health care provider for more information. WEIGHT AND DIET  Eat a healthy diet  Be sure to include plenty of vegetables, fruits, low-fat dairy products, and lean protein.  Do not eat a lot of foods high in solid fats, added sugars, or salt.  Get regular exercise. This is one of the most important things you can do for your health.  Most adults should exercise for at least 150 minutes each week. The exercise should increase your heart rate and make you sweat (moderate-intensity exercise).  Most adults should also do strengthening exercises at least twice a week. This is in addition to the moderate-intensity exercise.  Maintain a healthy weight  Body mass index (BMI) is a measurement that can be used to identify possible weight problems. It estimates body fat based on height and weight. Your health care provider can help determine your BMI and help you achieve or maintain a healthy weight.  For females 59 years of age and older:   A BMI below 18.5 is considered underweight.  A BMI of 18.5 to 24.9 is normal.  A BMI of 25 to 29.9 is considered overweight.  A BMI of 30 and above is considered obese.  Watch levels of cholesterol and blood lipids  You should start having your blood tested for lipids and cholesterol at 59 years of age, then have this test every 5 years.  You may need to have your cholesterol levels checked more often if:  Your lipid  or cholesterol levels are high.  You are older than 59 years of age.  You are at high risk for heart disease.  CANCER SCREENING   Lung Cancer  Lung cancer screening is recommended for adults 75-59 years old who are at high risk for lung cancer because of a history of smoking.  A yearly low-dose CT scan of the lungs is recommended for people who:  Currently smoke.  Have quit within the past 15 years.  Have at least a 30-pack-year history of smoking. A pack year is smoking an average of one pack of cigarettes a day for 1 year.  Yearly screening should continue until it has been 15 years since you quit.  Yearly screening should stop if you develop a health problem that would prevent you from having lung cancer treatment.  Breast Cancer  Practice breast self-awareness. This means understanding how your breasts normally appear and feel.  It also means doing regular breast self-exams. Let your health care provider know about any changes, no matter how small.  If you are in your 59s or 30s, you should have a clinical breast exam (CBE) by a health care provider every 1-3 years as part of a regular health exam.  If you are 25 or older, have a CBE every year. Also consider having a breast X-ray (mammogram) every year.  If you have a family history of breast cancer, talk to your health care provider about genetic screening.  If you  are at high risk for breast cancer, talk to your health care provider about having an MRI and a mammogram every year.  Breast cancer gene (BRCA) assessment is recommended for women who have family members with BRCA-related cancers. BRCA-related cancers include:  Breast.  Ovarian.  Tubal.  Peritoneal cancers.  Results of the assessment will determine the need for genetic counseling and BRCA1 and BRCA2 testing. Cervical Cancer Your health care provider may recommend that you be screened regularly for cancer of the pelvic organs (ovaries, uterus, and  vagina). This screening involves a pelvic examination, including checking for microscopic changes to the surface of your cervix (Pap test). You may be encouraged to have this screening done every 3 years, beginning at age 21.  For women ages 30-65, health care providers may recommend pelvic exams and Pap testing every 3 years, or they may recommend the Pap and pelvic exam, combined with testing for human papilloma virus (HPV), every 5 years. Some types of HPV increase your risk of cervical cancer. Testing for HPV may also be done on women of any age with unclear Pap test results.  Other health care providers may not recommend any screening for nonpregnant women who are considered low risk for pelvic cancer and who do not have symptoms. Ask your health care provider if a screening pelvic exam is right for you.  If you have had past treatment for cervical cancer or a condition that could lead to cancer, you need Pap tests and screening for cancer for at least 20 years after your treatment. If Pap tests have been discontinued, your risk factors (such as having a new sexual partner) need to be reassessed to determine if screening should resume. Some women have medical problems that increase the chance of getting cervical cancer. In these cases, your health care provider may recommend more frequent screening and Pap tests. Colorectal Cancer  This type of cancer can be detected and often prevented.  Routine colorectal cancer screening usually begins at 59 years of age and continues through 59 years of age.  Your health care provider may recommend screening at an earlier age if you have risk factors for colon cancer.  Your health care provider may also recommend using home test kits to check for hidden blood in the stool.  A small camera at the end of a tube can be used to examine your colon directly (sigmoidoscopy or colonoscopy). This is done to check for the earliest forms of colorectal  cancer.  Routine screening usually begins at age 50.  Direct examination of the colon should be repeated every 5-10 years through 59 years of age. However, you may need to be screened more often if early forms of precancerous polyps or small growths are found. Skin Cancer  Check your skin from head to toe regularly.  Tell your health care provider about any new moles or changes in moles, especially if there is a change in a mole's shape or color.  Also tell your health care provider if you have a mole that is larger than the size of a pencil eraser.  Always use sunscreen. Apply sunscreen liberally and repeatedly throughout the day.  Protect yourself by wearing long sleeves, pants, a wide-brimmed hat, and sunglasses whenever you are outside. HEART DISEASE, DIABETES, AND HIGH BLOOD PRESSURE   High blood pressure causes heart disease and increases the risk of stroke. High blood pressure is more likely to develop in:  People who have blood pressure in the high end   of the normal range (130-139/85-89 mm Hg).  People who are overweight or obese.  People who are African American.  If you are 38-23 years of age, have your blood pressure checked every 3-5 years. If you are 61 years of age or older, have your blood pressure checked every year. You should have your blood pressure measured twice--once when you are at a hospital or clinic, and once when you are not at a hospital or clinic. Record the average of the two measurements. To check your blood pressure when you are not at a hospital or clinic, you can use:  An automated blood pressure machine at a pharmacy.  A home blood pressure monitor.  If you are between 45 years and 39 years old, ask your health care provider if you should take aspirin to prevent strokes.  Have regular diabetes screenings. This involves taking a blood sample to check your fasting blood sugar level.  If you are at a normal weight and have a low risk for diabetes,  have this test once every three years after 59 years of age.  If you are overweight and have a high risk for diabetes, consider being tested at a younger age or more often. PREVENTING INFECTION  Hepatitis B  If you have a higher risk for hepatitis B, you should be screened for this virus. You are considered at high risk for hepatitis B if:  You were born in a country where hepatitis B is common. Ask your health care provider which countries are considered high risk.  Your parents were born in a high-risk country, and you have not been immunized against hepatitis B (hepatitis B vaccine).  You have HIV or AIDS.  You use needles to inject street drugs.  You live with someone who has hepatitis B.  You have had sex with someone who has hepatitis B.  You get hemodialysis treatment.  You take certain medicines for conditions, including cancer, organ transplantation, and autoimmune conditions. Hepatitis C  Blood testing is recommended for:  Everyone born from [birth year] through 1964.  Anyone with known risk factors for hepatitis C. Sexually transmitted infections (STIs)  You should be screened for sexually transmitted infections (STIs) including gonorrhea and chlamydia if:  You are sexually active and are younger than 59 years of age.  You are older than 59 years of age and your health care provider tells you that you are at risk for this type of infection.  Your sexual activity has changed since you were last screened and you are at an increased risk for chlamydia or gonorrhea. Ask your health care provider if you are at risk.  If you do not have HIV, but are at risk, it may be recommended that you take a prescription medicine daily to prevent HIV infection. This is called pre-exposure prophylaxis (PrEP). You are considered at risk if:  You are sexually active and do not regularly use condoms or know the HIV status of your partner(s).  You take drugs by injection.  You are sexually  active with a partner who has HIV. Talk with your health care provider about whether you are at high risk of being infected with HIV. If you choose to begin PrEP, you should first be tested for HIV. You should then be tested every 3 months for as long as you are taking PrEP.  PREGNANCY   If you are premenopausal and you may become pregnant, ask your health care provider about preconception counseling.  If you may  become pregnant, take 400 to 800 micrograms (mcg) of folic acid every day.  If you want to prevent pregnancy, talk to your health care provider about birth control (contraception). OSTEOPOROSIS AND MENOPAUSE   Osteoporosis is a disease in which the bones lose minerals and strength with aging. This can result in serious bone fractures. Your risk for osteoporosis can be identified using a bone density scan.  If you are 61 years of age or older, or if you are at risk for osteoporosis and fractures, ask your health care provider if you should be screened.  Ask your health care provider whether you should take a calcium or vitamin D supplement to lower your risk for osteoporosis.  Menopause may have certain physical symptoms and risks.  Hormone replacement therapy may reduce some of these symptoms and risks. Talk to your health care provider about whether hormone replacement therapy is right for you.  HOME CARE INSTRUCTIONS   Schedule regular health, dental, and eye exams.  Stay current with your immunizations.   Do not use any tobacco products including cigarettes, chewing tobacco, or electronic cigarettes.  If you are pregnant, do not drink alcohol.  If you are breastfeeding, limit how much and how often you drink alcohol.  Limit alcohol intake to no more than 1 drink per day for nonpregnant women. One drink equals 12 ounces of beer, 5 ounces of wine, or 1 ounces of hard liquor.  Do not use street drugs.  Do not share needles.  Ask your health care provider for help if  you need support or information about quitting drugs.  Tell your health care provider if you often feel depressed.  Tell your health care provider if you have ever been abused or do not feel safe at home.   This information is not intended to replace advice given to you by your health care provider. Make sure you discuss any questions you have with your health care provider.   Document Released: 09/15/2010 Document Revised: 03/23/2014 Document Reviewed: 02/01/2013 Elsevier Interactive Patient Education [employer].

## 2015-11-27 ENCOUNTER — Other Ambulatory Visit: Payer: Self-pay | Admitting: *Deleted

## 2015-11-27 ENCOUNTER — Inpatient Hospital Stay
Admission: RE | Admit: 2015-11-27 | Discharge: 2015-11-27 | Disposition: A | Discharge status: Home / Self Care | Payer: Self-pay | Source: Ambulatory Visit | Attending: *Deleted | Admitting: *Deleted

## 2015-11-27 DIAGNOSIS — Z9289 Personal history of other medical treatment: Secondary | ICD-10-CM

## 2015-11-27 LAB — PAP IG AND HPV HIGH-RISK
HPV, high-risk: NEGATIVE
PAP Smear Comment: 0

## 2015-11-28 ENCOUNTER — Telehealth: Payer: Self-pay

## 2015-11-28 NOTE — Telephone Encounter (Signed)
Pt advised.   Thanks,   -Wm Fruchter  

## 2015-11-28 NOTE — Telephone Encounter (Signed)
-----   Message from Mar Daring, [place] sent at 11/27/2015  4:41 PM EDT ----- Pap is normal, HPV negative.  Will repeat in 5 years if patient desires.

## 2015-11-28 NOTE — Telephone Encounter (Signed)
LMTCB 11/28/2015   Thanks,   -Mickel Baas

## 2015-12-25 ENCOUNTER — Ambulatory Visit
Admission: RE | Admit: 2015-12-25 | Discharge: 2015-12-25 | Disposition: A | Discharge status: Home / Self Care | Payer: BC Managed Care – PPO | Source: Ambulatory Visit | Attending: Physician Assistant | Admitting: Physician Assistant

## 2015-12-25 ENCOUNTER — Other Ambulatory Visit: Payer: Self-pay | Admitting: Physician Assistant

## 2015-12-25 ENCOUNTER — Telehealth: Payer: Self-pay

## 2015-12-25 DIAGNOSIS — Z1231 Encounter for screening mammogram for malignant neoplasm of breast: Secondary | ICD-10-CM | POA: Insufficient documentation

## 2015-12-25 DIAGNOSIS — Z1239 Encounter for other screening for malignant neoplasm of breast: Secondary | ICD-10-CM

## 2015-12-25 NOTE — Telephone Encounter (Signed)
Patient advised.

## 2015-12-25 NOTE — Telephone Encounter (Signed)
-----   Message from Mar Daring, [place] sent at 12/25/2015  2:59 PM EDT ----- Normal mammogram. Repeat screening in one year.

## 2015-12-26 ENCOUNTER — Telehealth: Payer: Self-pay

## 2015-12-26 LAB — COMPREHENSIVE METABOLIC PANEL
ALBUMIN: 4.3 g/dL (ref 3.5–5.5)
ALK PHOS: 62 IU/L (ref 39–117)
ALT: 14 IU/L (ref 0–32)
AST: 17 IU/L (ref 0–40)
Albumin/Globulin Ratio: 1.7 (ref 1.2–2.2)
BUN / CREAT RATIO: 17 (ref 9–23)
BUN: 16 mg/dL (ref 6–24)
Bilirubin Total: 0.4 mg/dL (ref 0.0–1.2)
CALCIUM: 9.2 mg/dL (ref 8.7–10.2)
CO2: 27 mmol/L (ref 18–29)
CREATININE: 0.94 mg/dL (ref 0.57–1.00)
Chloride: 102 mmol/L (ref 96–106)
GFR calc Af Amer: 77 mL/min/{1.73_m2} (ref >59)
GFR, EST NON AFRICAN AMERICAN: 67 mL/min/{1.73_m2} (ref >59)
GLUCOSE: 93 mg/dL (ref 65–99)
Globulin, Total: 2.6 g/dL (ref 1.5–4.5)
Potassium: 4.7 mmol/L (ref 3.5–5.2)
Sodium: 141 mmol/L (ref 134–144)
Total Protein: 6.9 g/dL (ref 6.0–8.5)

## 2015-12-26 LAB — LIPID PANEL
Chol/HDL Ratio: 3.4 ratio units (ref 0.0–4.4)
Cholesterol, Total: 222 mg/dL — ABNORMAL HIGH (ref 100–199)
HDL: 66 mg/dL (ref >39)
LDL Calculated: 135 mg/dL — ABNORMAL HIGH (ref 0–99)
TRIGLYCERIDES: 104 mg/dL (ref 0–149)
VLDL CHOLESTEROL CAL: 21 mg/dL (ref 5–40)

## 2015-12-26 LAB — CBC WITH DIFFERENTIAL/PLATELET
BASOS ABS: 0 10*3/uL (ref 0.0–0.2)
Basos: 1 %
EOS (ABSOLUTE): 0.3 10*3/uL (ref 0.0–0.4)
EOS: 6 %
HEMATOCRIT: 40.1 % (ref 34.0–46.6)
HEMOGLOBIN: 13.7 g/dL (ref 11.1–15.9)
IMMATURE GRANULOCYTES: 0 %
Immature Grans (Abs): 0 10*3/uL (ref 0.0–0.1)
LYMPHS ABS: 1.7 10*3/uL (ref 0.7–3.1)
LYMPHS: 41 %
MCH: 31 pg (ref 26.6–33.0)
MCHC: 34.2 g/dL (ref 31.5–35.7)
MCV: 91 fL (ref 79–97)
MONOCYTES: 6 %
Monocytes Absolute: 0.2 10*3/uL (ref 0.1–0.9)
NEUTROS PCT: 46 %
Neutrophils Absolute: 1.9 10*3/uL (ref 1.4–7.0)
Platelets: 195 10*3/uL (ref 150–379)
RBC: 4.42 x10E6/uL (ref 3.77–5.28)
RDW: 13.3 % (ref 12.3–15.4)
WBC: 4.1 10*3/uL (ref 3.4–10.8)

## 2015-12-26 LAB — HEPATITIS C ANTIBODY: Hep C Virus Ab: <0.1 s/co ratio (ref 0.0–0.9)

## 2015-12-26 LAB — HEMOGLOBIN A1C
ESTIMATED AVERAGE GLUCOSE: 114 mg/dL
HEMOGLOBIN A1C: 5.6 % (ref 4.8–5.6)

## 2015-12-26 LAB — TSH: TSH: 1.5 u[IU]/mL (ref 0.450–4.500)

## 2015-12-26 NOTE — Telephone Encounter (Signed)
Advised pt of lab results. Pt verbally acknowledges understanding. Elfa Wooton Drozdowski, CMA   

## 2015-12-26 NOTE — Telephone Encounter (Signed)
-----   Message from Mar Daring, [place] sent at 12/26/2015  8:42 AM EDT ----- All labs are within normal limits and stable.  Cholesterol borderline high. Continue healthy lifestyle modifications. If wanted and not taking she may add fish oil 1200 mg twice daily for cholesterol. Will recheck in one year. Thanks! -JB

## 2016-03-02 ENCOUNTER — Ambulatory Visit: Payer: BC Managed Care – PPO | Admitting: Physician Assistant

## 2016-03-03 ENCOUNTER — Ambulatory Visit
Admission: RE | Admit: 2016-03-03 | Discharge: 2016-03-03 | Disposition: A | Discharge status: Home / Self Care | Payer: BC Managed Care – PPO | Source: Ambulatory Visit | Attending: Physician Assistant | Admitting: Physician Assistant

## 2016-03-03 ENCOUNTER — Encounter: Payer: Self-pay | Admitting: Physician Assistant

## 2016-03-03 ENCOUNTER — Ambulatory Visit (INDEPENDENT_AMBULATORY_CARE_PROVIDER_SITE_OTHER): Payer: BC Managed Care – PPO | Admitting: Physician Assistant

## 2016-03-03 ENCOUNTER — Telehealth: Payer: Self-pay | Admitting: Physician Assistant

## 2016-03-03 VITALS — BP 112/66 | HR 68 | Temp 98.5°F | Resp 16 | Wt 149.0 lb

## 2016-03-03 DIAGNOSIS — R0781 Pleurodynia: Secondary | ICD-10-CM | POA: Diagnosis not present

## 2016-03-03 DIAGNOSIS — S29011A Strain of muscle and tendon of front wall of thorax, initial encounter: Secondary | ICD-10-CM

## 2016-03-03 MED ORDER — PREDNISONE 10 MG PO TABS: ORAL_TABLET | ORAL | 0 refills | Status: DC

## 2016-03-03 NOTE — Telephone Encounter (Signed)
Steroid taper sent in to [facility]

## 2016-03-03 NOTE — Telephone Encounter (Signed)
Pt stated she received voicemail and would like to try the steroid taper and see how that goes. [facility]. Thanks TNP

## 2016-03-03 NOTE — Patient Instructions (Signed)
Costochondritis Costochondritis is swelling and irritation (inflammation) of the tissue (cartilage) that connects your ribs to your breastbone (sternum). This causes pain in the front of your chest. The pain usually starts gradually and involves more than one rib. What are the causes? The exact cause of this condition is not always known. It results from stress on the cartilage where your ribs attach to your sternum. The cause of this stress could be:  Chest injury (trauma).  Exercise or activity, such as lifting.  Severe coughing. What increases the risk? You may be at higher risk for this condition if you:  Are female.  Are 30?59 years old.  Recently started a new exercise or work activity.  Have low levels of vitamin D.  Have a condition that makes you cough frequently. What are the signs or symptoms? The main symptom of this condition is chest pain. The pain:  Usually starts gradually and can be sharp or dull.  Gets worse with deep breathing, coughing, or exercise.  Gets better with rest.  May be worse when you press on the sternum-rib connection (tenderness). How is this diagnosed? This condition is diagnosed based on your symptoms, medical history, and a physical exam. Your health care provider will check for tenderness when pressing on your sternum. This is the most important finding. You may also have tests to rule out other causes of chest pain. These may include:  A chest X-ray to check for lung problems.  An electrocardiogram (ECG) to see if you have a heart problem that could be causing the pain.  An imaging scan to rule out a chest or rib fracture. How is this treated? This condition usually goes away on its own over time. Your health care provider may prescribe an NSAID to reduce pain and inflammation. Your health care provider may also suggest that you:  Rest and avoid activities that make pain worse.  Apply heat or cold to the area to reduce pain and  inflammation.  Do exercises to stretch your chest muscles. If these treatments do not help, your health care provider may inject a numbing medicine at the sternum-rib connection to help relieve the pain. Follow these instructions at home:  Avoid activities that make pain worse. This includes any activities that use chest, abdominal, and side muscles.  If directed, put ice on the painful area:  Put ice in a plastic bag.  Place a towel between your skin and the bag.  Leave the ice on for 20 minutes, 2-3 times a day.  If directed, apply heat to the affected area as often as told by your health care provider. Use the heat source that your health care provider recommends, such as a moist heat pack or a heating pad.  Place a towel between your skin and the heat source.  Leave the heat on for 20-30 minutes.  Remove the heat if your skin turns bright red. This is especially important if you are unable to feel pain, heat, or cold. You may have a greater risk of getting burned.  Take over-the-counter and prescription medicines only as told by your health care provider.  Return to your normal activities as told by your health care provider. Ask your health care provider what activities are safe for you.  Keep all follow-up visits as told by your health care provider. This is important. Contact a health care provider if:  You have chills or a fever.  Your pain does not go away or it gets worse.    You have a cough that does not go away (is persistent). Get help right away if:  You have shortness of breath. This information is not intended to replace advice given to you by your health care provider. Make sure you discuss any questions you have with your health care provider. Document Released: 12/10/2004 Document Revised: 09/20/2015 Document Reviewed: 06/26/2015 Elsevier Interactive Patient Education  2017 Elsevier Inc.  

## 2016-03-03 NOTE — Progress Notes (Signed)
Patient: Vanessa Cohen Female    DOB: February 05, 1956   60 y.o.   MRN: [id number] Visit Date: 03/03/2016  Today's Provider: Mar Daring, PA-C   Chief Complaint  Patient presents with  . Back Pain   Subjective:    Back Pain  The current episode started more than 1 month ago. The problem occurs daily. The problem has been gradually worsening since onset. Pain location: left flank. The pain does not radiate. The pain is at a severity of 3/10. Pain severity now: mild to moderate. The pain is the same all the time. The symptoms are aggravated by sitting. Associated symptoms include abdominal pain. Pertinent negatives include no bladder incontinence, bowel incontinence, chest pain, dysuria, fever, headaches, leg pain, numbness, pelvic pain, tingling, weakness or weight loss. She has tried nothing for the symptoms.  Pt was bending over railing late this summer, and was reaching for something. Pt reports she felt like a rib on her left side became dislocated. Pt reports the rib pain is better, but the back pain persists.     Allergies  Allergen Reactions  . Sulfa Antibiotics      Current Outpatient Prescriptions:  .  Ascorbic Acid (VITAMIN C) 1000 MG tablet, Take 1,000 mg by mouth daily.  , Disp: , Rfl:  .  aspirin 81 MG tablet, Take 81 mg by mouth daily.  , Disp: , Rfl:  .  carvedilol (COREG) 25 MG tablet, Take 25 mg by mouth 2 (two) times daily.  , Disp: , Rfl:  .  fluticasone (FLONASE) 50 MCG/ACT nasal spray, SPRAY 2 SPRAY IN EACH NOSRTIL DAILY  Pt needs to schedule an office visit before anymore refills., Disp: 48 g, Rfl: 5 .  furosemide (LASIX) 20 MG tablet, , Disp: , Rfl:  .  Omega-3 Fatty Acids (FISH OIL) 1000 MG CAPS, Take 1 capsule by mouth daily., Disp: , Rfl:  .  ramipril (ALTACE) 10 MG capsule, Take 10 mg by mouth daily.  , Disp: , Rfl:  .  cetirizine (ZYRTEC HIVES RELIEF) 10 MG tablet, Take 10 mg by mouth daily.  , Disp: , Rfl:  .  triamcinolone cream (KENALOG) 0.1  %, Apply 1 application topically 2 (two) times daily. (Patient not taking: Reported on 03/03/2016), Disp: 30 g, Rfl: 0  Review of Systems  Constitutional: Negative for fever and weight loss.  Respiratory: Negative for cough, chest tightness and shortness of breath.   Cardiovascular: Negative for chest pain, palpitations and leg swelling.  Gastrointestinal: Positive for abdominal pain. Negative for bowel incontinence.  Genitourinary: Negative for bladder incontinence, dysuria, enuresis, flank pain, hematuria, pelvic pain, vaginal bleeding, vaginal discharge and vaginal pain.  Musculoskeletal: Positive for back pain.  Neurological: Negative for dizziness, tingling, weakness, numbness and headaches.    Social History  Substance Use Topics  . Smoking status: Never Smoker  . Smokeless tobacco: Never Used  . Alcohol use Yes     Comment: occasional glass of wine   Objective:   BP 112/66 (BP Location: Left Arm, Patient Position: Sitting, Cuff Size: Normal)   Pulse 68   Temp 98.5 F (36.9 C) (Oral)   Resp 16   Wt 149 lb (67.6 kg)   BMI 22.99 kg/m   Physical Exam  Constitutional: She appears well-developed and well-nourished. No distress.  Neck: Normal range of motion. Neck supple.  Cardiovascular: Normal rate, regular rhythm and normal heart sounds.  Exam reveals no gallop and no friction rub.   No  murmur heard. Pulmonary/Chest: Effort normal and breath sounds normal. No respiratory distress. She has no wheezes. She has no rales.  Musculoskeletal:       Thoracic back: She exhibits tenderness and pain. She exhibits normal range of motion, no bony tenderness, no swelling, no edema, no deformity and no spasm.       Back:  Pain is noted on left lateral ribs, over the bottom ribs (approx 8-12) from anterior around to posterior. No palpable deformity. She does have tenderness to palpation. No decreased ROM, no pain with movement. No pain with deep breathing or cough. No spasm noted.   Skin:  She is not diaphoretic.  Vitals reviewed.       Assessment & Plan:     1. Rib pain on left side Will get xray to R/O bony abnormality. Continue moist heat and OTC NSAIDs for now. If xray is stable may try steroid dose pak for inflammation. If pain persists after that may consider CT abdomen for further evaluation. - DG Ribs Unilateral Left; Future     Patient seen and examined by Mar Daring, PA-C, and note scribed by Renaldo Fiddler, CMA.  Mar Daring, PA-C  [facility]

## 2016-03-03 NOTE — Telephone Encounter (Signed)
I have called patient and left a VM with results. I have asked her to call the office back and let me know if she is willing to do a trial of steroid taper to see if there is any change in symptoms. If still no improvement may consider CT abd/pelvis.

## 2016-04-06 ENCOUNTER — Telehealth: Payer: Self-pay | Admitting: Physician Assistant

## 2016-04-06 NOTE — Telephone Encounter (Signed)
lmtcb

## 2016-04-06 NOTE — Telephone Encounter (Signed)
Pt is requesting a call back from [facility] to discuss the Rx predniSONE (DELTASONE) 10 MG tablet.  CB#[phone]/MW

## 2016-04-07 NOTE — Telephone Encounter (Signed)
LMTCB

## 2016-04-07 NOTE — Telephone Encounter (Signed)
NA

## 2016-04-08 NOTE — Telephone Encounter (Signed)
Noted  

## 2016-04-08 NOTE — Telephone Encounter (Signed)
FYI: Patient reports she did great on prednisone given to her in Dec. 19th, 2017. Patient reports that she re injured her rib over the weekend. Patient reports she is taking Ibuprofen and reports moderate symptom control.

## 2016-07-17 ENCOUNTER — Encounter: Payer: Self-pay | Admitting: Physician Assistant

## 2016-07-17 ENCOUNTER — Ambulatory Visit (INDEPENDENT_AMBULATORY_CARE_PROVIDER_SITE_OTHER): Payer: BC Managed Care – PPO | Admitting: Physician Assistant

## 2016-07-17 VITALS — BP 112/72 | HR 56 | Temp 98.3°F | Resp 16 | Wt 153.0 lb

## 2016-07-17 DIAGNOSIS — S30861A Insect bite (nonvenomous) of abdominal wall, initial encounter: Secondary | ICD-10-CM | POA: Diagnosis not present

## 2016-07-17 DIAGNOSIS — W57XXXA Bitten or stung by nonvenomous insect and other nonvenomous arthropods, initial encounter: Secondary | ICD-10-CM | POA: Diagnosis not present

## 2016-07-17 NOTE — Patient Instructions (Signed)
Tick Bite Information, Adult Ticks are insects that draw blood for food. Most ticks live in shrubs and grassy areas. They climb onto people and animals that brush against the leaves and grasses that they rest on. Then they bite, attaching themselves to the skin. Most ticks are harmless, but some ticks carry germs that can spread to a person through a bite and cause a disease. To reduce your risk of getting a disease from a tick bite, it is important to take steps to prevent tick bites. It is also important to check for ticks after being outdoors. If you find that a tick has attached to you, watch for symptoms of disease. How can I prevent tick bites? Take these steps to help prevent tick bites when you are outdoors in an area where ticks are found:  Use insect repellent that has DEET (20% or higher), picaridin, or IR3535 in it. Use it on:  Skin that is showing.  The top of your boots.  Your pant legs.  Your sleeve cuffs.  For repellent products that contain permethrin, follow product instructions. Use these products on:  Clothing.  Gear.  Boots.  Tents.  Wear protective clothing. Long sleeves and long pants offer the best protection from ticks.  Wear light-colored clothing so you can see ticks more easily.  Tuck your pant legs into your socks.  If you go walking on a trail, stay in the middle of the trail so your skin, hair, and clothing do not touch the bushes.  Avoid walking through areas with long grass.  Check for ticks on your clothing, hair, and skin often while you are outside, and check again before you go inside. Make sure to check the places that ticks attach themselves most often. These places include the scalp, neck, armpits, waist, groin, and joint areas. Ticks that carry a disease called Lyme disease have to be attached to the skin for 24-48 hours. Checking for ticks every day will lessen your risk of this and other diseases.  When you come indoors, wash your  clothes and take a shower or a bath right away. Dry your clothes in a dryer on high heat for at least 60 minutes. This will kill any ticks in your clothes. What is the proper way to remove a tick? If you find a tick on your body, remove it as soon as possible. Removing a tick sooner rather than later can prevent germs from passing from the tick to your body. To remove a tick that is crawling on your skin but has not bitten:  Go outdoors and brush the tick off.  Remove the tick with tape or a lint roller. To remove a tick that is attached to your skin:  Wash your hands.  If you have latex gloves, put them on.  Use tweezers, curved forceps, or a tick-removal tool to gently grasp the tick as close to your skin and the tick's head as possible.  Gently pull with steady, upward pressure until the tick lets go. When removing the tick:  Take care to keep the tick's head attached to its body.  Do not twist or jerk the tick. This can make the tick's head or mouth break off.  Do not squeeze or crush the tick's body. This could force disease-carrying fluids from the tick into your body. Do not try to remove a tick with heat, alcohol, petroleum jelly, or fingernail polish. Using these methods can cause the tick to salivate and regurgitate into your  bloodstream, increasing your risk of getting a disease. What should I do after removing a tick?  Clean the bite area with soap and water, rubbing alcohol, or an iodine scrub.  If an antiseptic cream or ointment is available, apply a small amount to the bite site.  Wash and disinfect any instruments that you used to remove the tick. How should I dispose of a tick? To dispose of a live tick, use one of these methods:  Place it in rubbing alcohol.  Place it in a sealed bag or container.  Wrap it tightly in tape.  Flush it down the toilet. Contact a health care provider if:  You have symptoms of a disease after a tick bite. Symptoms of a  tick-borne disease can occur from moments after the tick bites to up to 30 days after a tick is removed. Symptoms include:  Muscle, joint, or bone pain.  Difficulty walking or moving your legs.  Numbness in the legs.  Paralysis.  Red rash around the tick bite area that is shaped like a target or a "bull's-eye."  Redness and swelling in the area of the tick bite.  Fever.  Repeated vomiting.  Diarrhea.  Weight loss.  Tender, swollen lymph glands.  Shortness of breath.  Cough.  Pain in the abdomen.  Headache.  Abnormal tiredness.  A change in your level of consciousness.  Confusion. Get help right away if:  You are not able to remove a tick.  A part of a tick breaks off and gets stuck in your skin.  Your symptoms get worse. Summary  Ticks may carry germs that can spread to a person through a bite and cause disease.  Wear protective clothing and use insect repellent to prevent tick bites. Follow product instructions.  If you find a tick on your body, remove it as soon as possible. If the tick is attached, do not try to remove with heat, alcohol, petroleum jelly, or fingernail polish.  Remove the attached tick using tweezers, curved forceps, or a tick-removal tool. Gently pull with steady, upward pressure until the tick lets go. Do not twist or jerk the tick. Do not squeeze or crush the tick's body.  If you have symptoms after being bitten by a tick, contact a health care provider. This information is not intended to replace advice given to you by your health care provider. Make sure you discuss any questions you have with your health care provider. Document Released: 02/28/2000 Document Revised: 12/13/2015 Document Reviewed: 12/13/2015 Elsevier Interactive Patient Education  2017 [employer].

## 2016-07-17 NOTE — Progress Notes (Signed)
Patient: Vanessa Cohen Female    DOB: 18-Sep-1955   61 y.o.   MRN: [id number] Visit Date: 07/17/2016  Today's Provider: Mar Daring, PA-C   Chief Complaint  Patient presents with  . Insect Bite   Subjective:    HPI  Tick Bite Pt noticed a tick attached to the left side of her groin yesterday. Pt was able to remove the body of the tick, but she believes the head is still attached. Pt has tried Neosporin for this problem. Pt is c/o itching, swelling and erythema of the bite. Pt denies headaches, rash, N/V, myalgias, fever. She also had another bite on her knee, that tick was removed without complication. She also has two other bites on the anterior thigh that she feels was from another type of insect, no tick attached there.   Allergies  Allergen Reactions  . Sulfa Antibiotics      Current Outpatient Prescriptions:  .  Ascorbic Acid (VITAMIN C) 1000 MG tablet, Take 1,000 mg by mouth daily.  , Disp: , Rfl:  .  aspirin 81 MG tablet, Take 81 mg by mouth daily.  , Disp: , Rfl:  .  carvedilol (COREG) 25 MG tablet, Take 25 mg by mouth 2 (two) times daily.  , Disp: , Rfl:  .  cetirizine (ZYRTEC HIVES RELIEF) 10 MG tablet, Take 10 mg by mouth daily.  , Disp: , Rfl:  .  fluticasone (FLONASE) 50 MCG/ACT nasal spray, SPRAY 2 SPRAY IN EACH NOSRTIL DAILY  Pt needs to schedule an office visit before anymore refills., Disp: 48 g, Rfl: 5 .  furosemide (LASIX) 20 MG tablet, , Disp: , Rfl:  .  doxycycline (PERIOSTAT) 20 MG tablet, Take 1 tablet by mouth 2 (two) times daily. , Disp: , Rfl:  .  Omega-3 Fatty Acids (FISH OIL) 1000 MG CAPS, Take 1 capsule by mouth daily., Disp: , Rfl:  .  ramipril (ALTACE) 10 MG capsule, Take 10 mg by mouth daily.  , Disp: , Rfl:  .  triamcinolone cream (KENALOG) 0.1 %, Apply 1 application topically 2 (two) times daily. (Patient not taking: Reported on 03/03/2016), Disp: 30 g, Rfl: 0  Review of Systems  Constitutional: Negative for activity change, appetite  change, chills, diaphoresis, fatigue, fever and unexpected weight change.  Respiratory: Negative for shortness of breath.   Cardiovascular: Negative for chest pain, palpitations and leg swelling.  Gastrointestinal: Negative for diarrhea, nausea and vomiting.  Musculoskeletal: Negative for myalgias.  Skin: Positive for wound. Negative for rash.    Social History  Substance Use Topics  . Smoking status: Never Smoker  . Smokeless tobacco: Never Used  . Alcohol use Yes     Comment: occasional glass of wine   Objective:   BP 112/72 (BP Location: Left Arm, Patient Position: Sitting, Cuff Size: Normal)   Pulse (!) 56   Temp 98.3 F (36.8 C) (Oral)   Resp 16   Wt 153 lb (69.4 kg)   BMI 23.61 kg/m  Vitals:   07/17/16 1348  BP: 112/72  Pulse: (!) 56  Resp: 16  Temp: 98.3 F (36.8 C)  TempSrc: Oral  Weight: 153 lb (69.4 kg)     Physical Exam  Constitutional: She appears well-developed and well-nourished. No distress.  Skin:     All lesions are pruritic.  Vitals reviewed.       Assessment & Plan:     1. Tick bite of groin, initial encounter Head of tick removed  from left groin. Advised to continue Bacitracin ointment. May add hydrocortisone for itching and inflammation. Advised to watch for increasing rash or bulls's eye lesion, or for developing a diffuse rash with uncontrolled fevers. She agrees and will call if symptoms worsen.        Mar Daring, PA-C  [facility]

## 2016-07-27 ENCOUNTER — Telehealth: Payer: Self-pay | Admitting: Physician Assistant

## 2016-07-27 NOTE — Telephone Encounter (Signed)
Patient reports that she has 2 new tick bites one her back and one on wrist. Patient reports she pulled off tick completely. Patient denies any fever, discharge from both areas. Patient reports redness, itching and warm to touch. Patient reports she is taking Zyrtec, and putting polysporin on sites and takes a low dose of doxy daily due to rosacea. Patient scheduled for 07/31/2016.

## 2016-07-27 NOTE — Telephone Encounter (Signed)
Vanessa Cohen aware. sd

## 2016-07-27 NOTE — Telephone Encounter (Signed)
Pt has gotten another tick bite.  It is a little red and swollen.  She wanted to talk with you about it.  Her call back  [phone]  Thank sTeri

## 2016-07-31 ENCOUNTER — Ambulatory Visit: Payer: Self-pay | Admitting: Physician Assistant

## 2016-11-27 ENCOUNTER — Encounter: Payer: BC Managed Care – PPO | Admitting: Physician Assistant

## 2016-12-04 ENCOUNTER — Telehealth: Payer: Self-pay | Admitting: Physician Assistant

## 2016-12-04 DIAGNOSIS — Z1239 Encounter for other screening for malignant neoplasm of breast: Secondary | ICD-10-CM

## 2016-12-04 NOTE — Telephone Encounter (Signed)
Pt is asking about having a mammogram scheduled.  Pt is asking if she need to have this done every year.  CB#[phone]/MW

## 2016-12-04 NOTE — Telephone Encounter (Signed)
Please review-Miqueas Whilden V Azahel Belcastro, RMA  

## 2016-12-07 NOTE — Telephone Encounter (Signed)
Mammogram ordered

## 2016-12-07 NOTE — Telephone Encounter (Signed)
Patient advised.  Thanks,  -Aitanna Haubner 

## 2016-12-07 NOTE — Telephone Encounter (Signed)
Guidelines state that mammograms should be done yearly until 65 then can be done every 2 years. She is due for CPE, last was 11/25/15. We can schedule mammogram at CPE.

## 2016-12-07 NOTE — Telephone Encounter (Signed)
Patient advised as directed below. Per patient she is scheduled for CPE 11/02 . She is requesting if is possible to get her mammogram referral in. Last one was 11/2015 and she wants to keep it around the same time.

## 2017-01-12 ENCOUNTER — Ambulatory Visit
Admission: RE | Admit: 2017-01-12 | Discharge: 2017-01-12 | Disposition: A | Discharge status: Home / Self Care | Payer: BC Managed Care – PPO | Source: Ambulatory Visit | Attending: Physician Assistant | Admitting: Physician Assistant

## 2017-01-12 DIAGNOSIS — Z1231 Encounter for screening mammogram for malignant neoplasm of breast: Secondary | ICD-10-CM | POA: Insufficient documentation

## 2017-01-12 DIAGNOSIS — Z1239 Encounter for other screening for malignant neoplasm of breast: Secondary | ICD-10-CM

## 2017-01-12 HISTORY — DX: Malignant (primary) neoplasm, unspecified: C80.1

## 2017-01-15 ENCOUNTER — Encounter: Payer: Self-pay | Admitting: Physician Assistant

## 2017-01-15 ENCOUNTER — Ambulatory Visit (INDEPENDENT_AMBULATORY_CARE_PROVIDER_SITE_OTHER): Payer: BC Managed Care – PPO | Admitting: Physician Assistant

## 2017-01-15 VITALS — BP 120/80 | HR 68 | Temp 98.3°F | Resp 16 | Ht 68.0 in | Wt 152.8 lb

## 2017-01-15 DIAGNOSIS — Z1231 Encounter for screening mammogram for malignant neoplasm of breast: Secondary | ICD-10-CM

## 2017-01-15 DIAGNOSIS — Z0000 Encounter for general adult medical examination without abnormal findings: Secondary | ICD-10-CM | POA: Diagnosis not present

## 2017-01-15 DIAGNOSIS — Z114 Encounter for screening for human immunodeficiency virus [HIV]: Secondary | ICD-10-CM

## 2017-01-15 DIAGNOSIS — I10 Essential (primary) hypertension: Secondary | ICD-10-CM | POA: Diagnosis not present

## 2017-01-15 DIAGNOSIS — I5022 Chronic systolic (congestive) heart failure: Secondary | ICD-10-CM | POA: Diagnosis not present

## 2017-01-15 DIAGNOSIS — E7800 Pure hypercholesterolemia, unspecified: Secondary | ICD-10-CM | POA: Diagnosis not present

## 2017-01-15 DIAGNOSIS — Z1239 Encounter for other screening for malignant neoplasm of breast: Secondary | ICD-10-CM

## 2017-01-15 NOTE — Progress Notes (Signed)
Patient: Vanessa Cohen, Female    DOB: 11/28/1955, 61 y.o.   MRN: [id number] Visit Date: 01/15/2017  Today's Provider: Mar Daring, PA-C   Chief Complaint  Patient presents with  . Annual Exam   Subjective:    Annual wellness visit Vanessa Cohen is a 60 y.o. female. She feels well. She reports exercising regularly. She reports she is sleeping well.  11/25/15 CPE 11/25/15 Pap-neg; HPV-Neg 12/25/15 mammogram-BI-RADS 1 12/05/09 Colonoscopy-polyps -----------------------------------------------------------   Review of Systems  Constitutional: Negative.   HENT: Positive for ear pain (left on and off) and sneezing.   Eyes: Negative.   Respiratory: Negative.   Cardiovascular: Negative.   Gastrointestinal: Negative.   Endocrine: Negative.   Genitourinary: Negative.   Musculoskeletal: Positive for arthralgias.  Skin: Negative.   Allergic/Immunologic: Negative.   Neurological: Negative.   Hematological: Negative.   Psychiatric/Behavioral: Negative.     Social History   Social History  . Marital status: Single    Spouse name: N/A  . Number of children: N/A  . Years of education: N/A   Occupational History  . Not on file.   Social History Main Topics  . Smoking status: Never Smoker  . Smokeless tobacco: Never Used  . Alcohol use Yes     Comment: occasional glass of wine  . Drug use: No  . Sexual activity: Not on file   Other Topics Concern  . Not on file   Social History Narrative   Full time. Does not regularly exercise.     Past Medical History:  Diagnosis Date  . Automatic implantable cardiac defibrillator in situ   . Cancer ([place])    melanoma  . Congestive heart failure, unspecified   . Secondary cardiomyopathy, unspecified      Patient Active Problem List   Diagnosis Date Noted  . Primary osteoarthritis of both knees 11/25/2015  . Hypercholesteremia 11/25/2015  . Cardiac pacemaker in situ 01/26/2013  . Hypertension 08/26/2012    . Chronic systolic CHF (congestive heart failure) ([place]) 08/26/2012  . Allergic rhinitis 05/24/2009  . CARDIOMYOPATHY, SECONDARY 01/02/2009  . CONGESTIVE HEART FAILURE, UNSPECIFIED 01/02/2009  . H/O malignant neoplasm of skin 03/16/2006  . Osteopenia 04/11/2003  . Disorder of skeletal muscle 03/16/1998    Past Surgical History:  Procedure Laterality Date  . BREAST BIOPSY Left    core bx- neg    Her family history includes Cancer in her father; Heart disease in her mother; Stroke in her other.      Current Outpatient Prescriptions:  .  Ascorbic Acid (VITAMIN C) 1000 MG tablet, Take 1,000 mg by mouth daily.  , Disp: , Rfl:  .  aspirin 81 MG tablet, Take 81 mg by mouth daily.  , Disp: , Rfl:  .  carvedilol (COREG) 25 MG tablet, Take 25 mg by mouth 2 (two) times daily.  , Disp: , Rfl:  .  cetirizine (ZYRTEC HIVES RELIEF) 10 MG tablet, Take 10 mg by mouth daily.  , Disp: , Rfl:  .  doxycycline (PERIOSTAT) 20 MG tablet, Take 1 tablet by mouth 2 (two) times daily. , Disp: , Rfl:  .  furosemide (LASIX) 20 MG tablet, , Disp: , Rfl:  .  Omega-3 Fatty Acids (FISH OIL) 1000 MG CAPS, Take 1 capsule by mouth daily., Disp: , Rfl:  .  ramipril (ALTACE) 10 MG capsule, Take 10 mg by mouth daily.  , Disp: , Rfl:  .  fluticasone (FLONASE) 50 MCG/ACT nasal spray, SPRAY 2  SPRAY IN EACH NOSRTIL DAILY  Pt needs to schedule an office visit before anymore refills. (Patient not taking: Reported on 01/15/2017), Disp: 48 g, Rfl: 5  Patient Care Team: Mar Daring, PA-C as PCP - General (Family Medicine)     Objective:   Vitals: BP 120/80 (BP Location: Left Arm, Patient Position: Sitting, Cuff Size: Large)   Pulse 68   Temp 98.3 F (36.8 C) (Oral)   Resp 16   Ht 5\' 8"  (1.727 m)   Wt 152 lb 12.8 oz (69.3 kg)   SpO2 98%   BMI 23.23 kg/m   Physical Exam  Constitutional: She is oriented to person, place, and time. She appears well-developed and well-nourished. No distress.  HENT:  Head:  Normocephalic and atraumatic.  Right Ear: Hearing, tympanic membrane, external ear and ear canal normal.  Left Ear: Hearing, tympanic membrane, external ear and ear canal normal.  Nose: Nose normal.  Mouth/Throat: Uvula is midline, oropharynx is clear and moist and mucous membranes are normal. No oropharyngeal exudate.  Eyes: Pupils are equal, round, and reactive to light. Conjunctivae and EOM are normal. Right eye exhibits no discharge. Left eye exhibits no discharge. No scleral icterus.  Neck: Normal range of motion. Neck supple. No JVD present. Carotid bruit is not present. No tracheal deviation present. No thyromegaly present.  Cardiovascular: Normal rate, regular rhythm, normal heart sounds and intact distal pulses.  Exam reveals no gallop and no friction rub.   No murmur heard. Pulmonary/Chest: Effort normal and breath sounds normal. No respiratory distress. She has no wheezes. She has no rales. She exhibits no tenderness.  Abdominal: Soft. Bowel sounds are normal. She exhibits no distension and no mass. There is no tenderness. There is no rebound and no guarding.  Musculoskeletal: Normal range of motion. She exhibits no edema or tenderness.  Lymphadenopathy:    She has no cervical adenopathy.  Neurological: She is alert and oriented to person, place, and time.  Skin: Skin is warm and dry. No rash noted. She is not diaphoretic.  Psychiatric: She has a normal mood and affect. Her behavior is normal. Judgment and thought content normal.  Vitals reviewed.    Assessment & Plan:     Annual Wellness Visit  Reviewed patient's Family Medical History Reviewed and updated list of patient's medical providers Assessment of cognitive impairment was done Assessed patient's functional ability Established a written schedule for health screening [place] Completed and Reviewed  Exercise Activities and Dietary recommendations Goals    None       There is no  immunization history on file for this patient.  Health Maintenance  Topic Date Due  . HIV Screening  09/02/1971  . TETANUS/TDAP  03/28/2015  . INFLUENZA VACCINE  10/14/2016  . MAMMOGRAM  12/24/2016  . PAP SMEAR  11/25/2018  . COLONOSCOPY  12/06/2019  . Hepatitis C Screening  Completed     Discussed health benefits of physical activity, and encouraged her to engage in regular exercise appropriate for her age and condition.    1. Annual physical exam Normal physical exam today. Will check labs as below and f/u pending lab results. If labs are stable and WNL she will not need to have these rechecked for one year at her next annual physical exam. She is to call the office in the meantime if she has any acute issue, questions or concerns. - CBC w/Diff/Platelet - TSH - HgB A1c  2. Breast cancer screening Mammogram done on 01/12/17,  still awaiting results. Patient does self breast exams regularly. No family history.   3. Hypercholesteremia Borderline. Will check labs as below and f/u pending results. - COMPLETE METABOLIC PANEL WITH GFR - Lipid Profile - HgB A1c  4. Chronic systolic CHF (congestive heart failure) (HCC) Stable. Followed by Dr. Estrella Myrtle BID, next appt in December 2018. Patient has pacemaker in situ. Will check labs as below and f/u pending results. Continue ramipril 10mg , carvedilol 25mg  BID and furosemide 20mg .  - COMPLETE METABOLIC PANEL WITH GFR - Lipid Profile - HgB A1c  5. Essential hypertension Stable. Continue ramipril 10mg  daily and carvedilol 25mg  BID.  - COMPLETE METABOLIC PANEL WITH GFR - TSH - Lipid Profile - HgB A1c  6. Screening for HIV without presence of risk factors - HIV antibody (with reflex)  ------------------------------------------------------------------------------------------------------------    Mar Daring, PA-C  [facility]

## 2017-01-15 NOTE — Patient Instructions (Signed)
Health Maintenance for Postmenopausal Women Menopause is a normal process in which your reproductive ability comes to an end. This process happens gradually over a span of months to years, usually between the ages of 22 and 9. Menopause is complete when you have missed 12 consecutive menstrual periods. It is important to talk with your health care provider about some of the most common conditions that affect postmenopausal women, such as heart disease, cancer, and bone loss (osteoporosis). Adopting a healthy lifestyle and getting preventive care can help to promote your health and wellness. Those actions can also lower your chances of developing some of these common conditions. What should I know about menopause? During menopause, you may experience a number of symptoms, such as:  Moderate-to-severe hot flashes.  Night sweats.  Decrease in sex drive.  Mood swings.  Headaches.  Tiredness.  Irritability.  Memory problems.  Insomnia.  Choosing to treat or not to treat menopausal changes is an individual decision that you make with your health care provider. What should I know about hormone replacement therapy and supplements? Hormone therapy products are effective for treating symptoms that are associated with menopause, such as hot flashes and night sweats. Hormone replacement carries certain risks, especially as you become older. If you are thinking about using estrogen or estrogen with progestin treatments, discuss the benefits and risks with your health care provider. What should I know about heart disease and stroke? Heart disease, heart attack, and stroke become more likely as you age. This may be due, in part, to the hormonal changes that your body experiences during menopause. These can affect how your body processes dietary fats, triglycerides, and cholesterol. Heart attack and stroke are both medical emergencies. There are many things that you can do to help prevent heart disease  and stroke:  Have your blood pressure checked at least every 1-2 years. High blood pressure causes heart disease and increases the risk of stroke.  If you are 53-22 years old, ask your health care provider if you should take aspirin to prevent a heart attack or a stroke.  Do not use any tobacco products, including cigarettes, chewing tobacco, or electronic cigarettes. If you need help quitting, ask your health care provider.  It is important to eat a healthy diet and maintain a healthy weight. ? Be sure to include plenty of vegetables, fruits, low-fat dairy products, and lean protein. ? Avoid eating foods that are high in solid fats, added sugars, or salt (sodium).  Get regular exercise. This is one of the most important things that you can do for your health. ? Try to exercise for at least 150 minutes each week. The type of exercise that you do should increase your heart rate and make you sweat. This is known as moderate-intensity exercise. ? Try to do strengthening exercises at least twice each week. Do these in addition to the moderate-intensity exercise.  Know your numbers.Ask your health care provider to check your cholesterol and your blood glucose. Continue to have your blood tested as directed by your health care provider.  What should I know about cancer screening? There are several types of cancer. Take the following steps to reduce your risk and to catch any cancer development as early as possible. Breast Cancer  Practice breast self-awareness. ? This means understanding how your breasts normally appear and feel. ? It also means doing regular breast self-exams. Let your health care provider know about any changes, no matter how small.  If you are 40  or older, have a clinician do a breast exam (clinical breast exam or CBE) every year. Depending on your age, family history, and medical history, it may be recommended that you also have a yearly breast X-ray (mammogram).  If you  have a family history of breast cancer, talk with your health care provider about genetic screening.  If you are at high risk for breast cancer, talk with your health care provider about having an MRI and a mammogram every year.  Breast cancer (BRCA) gene test is recommended for women who have family members with BRCA-related cancers. Results of the assessment will determine the need for genetic counseling and BRCA1 and for BRCA2 testing. BRCA-related cancers include these types: ? Breast. This occurs in males or females. ? Ovarian. ? Tubal. This may also be called fallopian tube cancer. ? Cancer of the abdominal or pelvic lining (peritoneal cancer). ? Prostate. ? Pancreatic.  Cervical, Uterine, and Ovarian Cancer Your health care provider may recommend that you be screened regularly for cancer of the pelvic organs. These include your ovaries, uterus, and vagina. This screening involves a pelvic exam, which includes checking for microscopic changes to the surface of your cervix (Pap test).  For women ages 21-65, health care providers may recommend a pelvic exam and a Pap test every three years. For women ages 79-65, they may recommend the Pap test and pelvic exam, combined with testing for human papilloma virus (HPV), every five years. Some types of HPV increase your risk of cervical cancer. Testing for HPV may also be done on women of any age who have unclear Pap test results.  Other health care providers may not recommend any screening for nonpregnant women who are considered low risk for pelvic cancer and have no symptoms. Ask your health care provider if a screening pelvic exam is right for you.  If you have had past treatment for cervical cancer or a condition that could lead to cancer, you need Pap tests and screening for cancer for at least 20 years after your treatment. If Pap tests have been discontinued for you, your risk factors (such as having a new sexual partner) need to be  reassessed to determine if you should start having screenings again. Some women have medical problems that increase the chance of getting cervical cancer. In these cases, your health care provider may recommend that you have screening and Pap tests more often.  If you have a family history of uterine cancer or ovarian cancer, talk with your health care provider about genetic screening.  If you have vaginal bleeding after reaching menopause, tell your health care provider.  There are currently no reliable tests available to screen for ovarian cancer.  Lung Cancer Lung cancer screening is recommended for adults 69-62 years old who are at high risk for lung cancer because of a history of smoking. A yearly low-dose CT scan of the lungs is recommended if you:  Currently smoke.  Have a history of at least 30 pack-years of smoking and you currently smoke or have quit within the past 15 years. A pack-year is smoking an average of one pack of cigarettes per day for one year.  Yearly screening should:  Continue until it has been 15 years since you quit.  Stop if you develop a health problem that would prevent you from having lung cancer treatment.  Colorectal Cancer  This type of cancer can be detected and can often be prevented.  Routine colorectal cancer screening usually begins at  age 42 and continues through age 45.  If you have risk factors for colon cancer, your health care provider may recommend that you be screened at an earlier age.  If you have a family history of colorectal cancer, talk with your health care provider about genetic screening.  Your health care provider may also recommend using home test kits to check for hidden blood in your stool.  A small camera at the end of a tube can be used to examine your colon directly (sigmoidoscopy or colonoscopy). This is done to check for the earliest forms of colorectal cancer.  Direct examination of the colon should be repeated every  5-10 years until age 71. However, if early forms of precancerous polyps or small growths are found or if you have a family history or genetic risk for colorectal cancer, you may need to be screened more often.  Skin Cancer  Check your skin from head to toe regularly.  Monitor any moles. Be sure to tell your health care provider: ? About any new moles or changes in moles, especially if there is a change in a mole's shape or color. ? If you have a mole that is larger than the size of a pencil eraser.  If any of your family members has a history of skin cancer, especially at a young age, talk with your health care provider about genetic screening.  Always use sunscreen. Apply sunscreen liberally and repeatedly throughout the day.  Whenever you are outside, protect yourself by wearing long sleeves, pants, a wide-brimmed hat, and sunglasses.  What should I know about osteoporosis? Osteoporosis is a condition in which bone destruction happens more quickly than new bone creation. After menopause, you may be at an increased risk for osteoporosis. To help prevent osteoporosis or the bone fractures that can happen because of osteoporosis, the following is recommended:  If you are 46-71 years old, get at least 1,000 mg of calcium and at least 600 mg of vitamin D per day.  If you are older than age 55 but younger than age 65, get at least 1,200 mg of calcium and at least 600 mg of vitamin D per day.  If you are older than age 54, get at least 1,200 mg of calcium and at least 800 mg of vitamin D per day.  Smoking and excessive alcohol intake increase the risk of osteoporosis. Eat foods that are rich in calcium and vitamin D, and do weight-bearing exercises several times each week as directed by your health care provider. What should I know about how menopause affects my mental health? Depression may occur at any age, but it is more common as you become older. Common symptoms of depression  include:  Low or sad mood.  Changes in sleep patterns.  Changes in appetite or eating patterns.  Feeling an overall lack of motivation or enjoyment of activities that you previously enjoyed.  Frequent crying spells.  Talk with your health care provider if you think that you are experiencing depression. What should I know about immunizations? It is important that you get and maintain your immunizations. These include:  Tetanus, diphtheria, and pertussis (Tdap) booster vaccine.  Influenza every year before the flu season begins.  Pneumonia vaccine.  Shingles vaccine.  Your health care provider may also recommend other immunizations. This information is not intended to replace advice given to you by your health care provider. Make sure you discuss any questions you have with your health care provider. Document Released: 04/24/2005  Document Revised: 09/20/2015 Document Reviewed: 12/04/2014 Elsevier Interactive Patient Education  2018 [employer]  

## 2017-01-18 ENCOUNTER — Telehealth: Payer: Self-pay

## 2017-01-18 ENCOUNTER — Other Ambulatory Visit: Payer: Self-pay | Admitting: *Deleted

## 2017-01-18 ENCOUNTER — Inpatient Hospital Stay
Admission: RE | Admit: 2017-01-18 | Discharge: 2017-01-18 | Disposition: A | Discharge status: Home / Self Care | Payer: Self-pay | Source: Ambulatory Visit | Attending: *Deleted | Admitting: *Deleted

## 2017-01-18 DIAGNOSIS — Z9289 Personal history of other medical treatment: Secondary | ICD-10-CM

## 2017-01-18 NOTE — Telephone Encounter (Signed)
Patient advised as below.  

## 2017-01-18 NOTE — Telephone Encounter (Signed)
-----   Message from Mar Daring, [place] sent at 01/18/2017  2:27 PM EST ----- Normal mammogram. Repeat screening in one year.

## 2017-02-13 LAB — COMPLETE METABOLIC PANEL WITH GFR
AG Ratio: 1.7 (calc) (ref 1.0–2.5)
ALKALINE PHOSPHATASE (APISO): 53 U/L (ref 33–130)
ALT: 13 U/L (ref 6–29)
AST: 19 U/L (ref 10–35)
Albumin: 4 g/dL (ref 3.6–5.1)
BUN: 13 mg/dL (ref 7–25)
CALCIUM: 8.7 mg/dL (ref 8.6–10.4)
CO2: 28 mmol/L (ref 20–32)
CREATININE: 0.75 mg/dL (ref 0.50–0.99)
Chloride: 106 mmol/L (ref 98–110)
GFR, Est African American: 100 mL/min/{1.73_m2} (ref >=60)
GFR, Est Non African American: 87 mL/min/{1.73_m2} (ref >=60)
GLUCOSE: 93 mg/dL (ref 65–99)
Globulin: 2.4 g/dL (calc) (ref 1.9–3.7)
Potassium: 4 mmol/L (ref 3.5–5.3)
SODIUM: 141 mmol/L (ref 135–146)
Total Bilirubin: 0.7 mg/dL (ref 0.2–1.2)
Total Protein: 6.4 g/dL (ref 6.1–8.1)

## 2017-02-13 LAB — CBC WITH DIFFERENTIAL/PLATELET
BASOS ABS: 49 {cells}/uL (ref 0–200)
Basophils Relative: 1.2 %
EOS PCT: 5.3 %
Eosinophils Absolute: 217 cells/uL (ref 15–500)
HCT: 38.4 % (ref 35.0–45.0)
Hemoglobin: 13.3 g/dL (ref 11.7–15.5)
Lymphs Abs: 1447 cells/uL (ref 850–3900)
MCH: 31.8 pg (ref 27.0–33.0)
MCHC: 34.6 g/dL (ref 32.0–36.0)
MCV: 91.9 fL (ref 80.0–100.0)
MONOS PCT: 7.5 %
MPV: 10.6 fL (ref 7.5–12.5)
NEUTROS PCT: 50.7 %
Neutro Abs: 2079 cells/uL (ref 1500–7800)
PLATELETS: 174 10*3/uL (ref 140–400)
RBC: 4.18 10*6/uL (ref 3.80–5.10)
RDW: 13 % (ref 11.0–15.0)
Total Lymphocyte: 35.3 %
WBC mixed population: 308 cells/uL (ref 200–950)
WBC: 4.1 10*3/uL (ref 3.8–10.8)

## 2017-02-13 LAB — LIPID PANEL
CHOLESTEROL: 220 mg/dL — AB (ref <200)
HDL: 68 mg/dL (ref >50)
LDL CHOLESTEROL (CALC): 131 mg/dL — AB
Non-HDL Cholesterol (Calc): 152 mg/dL (calc) — ABNORMAL HIGH (ref <130)
TRIGLYCERIDES: 103 mg/dL (ref <150)
Total CHOL/HDL Ratio: 3.2 (calc) (ref <5.0)

## 2017-02-13 LAB — HIV ANTIBODY (ROUTINE TESTING W REFLEX): HIV: NONREACTIVE

## 2017-02-13 LAB — HEMOGLOBIN A1C
EAG (MMOL/L): 6 (calc)
Hgb A1c MFr Bld: 5.4 % of total Hgb (ref <5.7)
MEAN PLASMA GLUCOSE: 108 (calc)

## 2017-02-13 LAB — TSH: TSH: 0.8 m[IU]/L (ref 0.40–4.50)

## 2017-02-15 ENCOUNTER — Telehealth: Payer: Self-pay

## 2017-02-15 NOTE — Telephone Encounter (Signed)
-----   Message from Mar Daring, [place] sent at 02/15/2017  8:33 AM EST ----- Cholesterol still elevated slightly but unchanged from last year. All other labs are WNL and stable.

## 2017-02-15 NOTE — Telephone Encounter (Signed)
Pt advised.   Thanks,   -Malani Lees  

## 2017-04-28 ENCOUNTER — Other Ambulatory Visit: Payer: Self-pay | Admitting: Physician Assistant

## 2017-04-28 DIAGNOSIS — J309 Allergic rhinitis, unspecified: Secondary | ICD-10-CM

## 2017-04-28 MED ORDER — FLUTICASONE PROPIONATE 50 MCG/ACT NA SUSP: NASAL | 5 refills | Status: DC

## 2017-04-28 NOTE — Telephone Encounter (Signed)
Patient is requesting a refill for the following medication  fluticasone (FLONASE) 50 MCG/ACT nasal spray  She uses [facility] in [place]

## 2017-04-28 NOTE — Telephone Encounter (Signed)
Please review. Thanks!  

## 2017-06-02 ENCOUNTER — Encounter: Payer: Self-pay | Admitting: Physician Assistant

## 2017-06-02 ENCOUNTER — Ambulatory Visit: Payer: BC Managed Care – PPO | Admitting: Physician Assistant

## 2017-06-02 VITALS — BP 106/70 | HR 84 | Temp 100.0°F | Resp 16 | Wt 146.0 lb

## 2017-06-02 DIAGNOSIS — J101 Influenza due to other identified influenza virus with other respiratory manifestations: Secondary | ICD-10-CM | POA: Diagnosis not present

## 2017-06-02 DIAGNOSIS — R509 Fever, unspecified: Secondary | ICD-10-CM

## 2017-06-02 DIAGNOSIS — R059 Cough, unspecified: Secondary | ICD-10-CM

## 2017-06-02 DIAGNOSIS — R05 Cough: Secondary | ICD-10-CM

## 2017-06-02 LAB — POCT INFLUENZA A/B
Influenza A, POC: POSITIVE — AB
Influenza B, POC: NEGATIVE

## 2017-06-02 MED ORDER — OSELTAMIVIR PHOSPHATE 75 MG PO CAPS: 75.0000 mg | ORAL_CAPSULE | Freq: Two times a day (BID) | ORAL | 0 refills | Status: AC

## 2017-06-02 NOTE — Progress Notes (Signed)
Patient: Vanessa Cohen Female    DOB: 03/06/1956   62 y.o.   MRN: [id number] Visit Date: 06/02/2017  Today's Provider: Trinna Post, PA-C   Chief Complaint  Patient presents with  . URI  . Fever   Subjective:    Vanessa Cohen is a 62 y/o woman with history of CHF presenting today with cough, congestion, and fever x 1 day.   URI   This is a new problem. The current episode started yesterday. The problem has been gradually worsening. The maximum temperature recorded prior to her arrival was 100.4 - 100.9 F. Associated symptoms include congestion, coughing, headaches, sinus pain and a sore throat. Pertinent negatives include no abdominal pain, diarrhea, dysuria, ear pain, plugged ear sensation, rhinorrhea, sneezing, swollen glands, vomiting or wheezing. She has tried decongestant for the symptoms. The treatment provided mild relief.  Fever   This is a new problem. The current episode started yesterday. The problem has been unchanged. The maximum temperature noted was 100 to 100.9 F. The temperature was taken using an oral thermometer. Associated symptoms include congestion, coughing, headaches and a sore throat. Pertinent negatives include no abdominal pain, diarrhea, ear pain, urinary pain, vomiting or wheezing.       Allergies  Allergen Reactions  . Sulfa Antibiotics      Current Outpatient Medications:  .  Ascorbic Acid (VITAMIN C) 1000 MG tablet, Take 1,000 mg by mouth daily.  , Disp: , Rfl:  .  aspirin 81 MG tablet, Take 81 mg by mouth daily.  , Disp: , Rfl:  .  carvedilol (COREG) 25 MG tablet, Take 25 mg by mouth 2 (two) times daily.  , Disp: , Rfl:  .  cetirizine (ZYRTEC HIVES RELIEF) 10 MG tablet, Take 10 mg by mouth daily.  , Disp: , Rfl:  .  fluticasone (FLONASE) 50 MCG/ACT nasal spray, SPRAY 2 SPRAY IN EACH NOSRTIL DAILY  Pt needs to schedule an office visit before anymore refills., Disp: 48 g, Rfl: 5 .  furosemide (LASIX) 20 MG tablet, , Disp: , Rfl:  .   Omega-3 Fatty Acids (FISH OIL) 1000 MG CAPS, Take 1 capsule by mouth daily., Disp: , Rfl:  .  ramipril (ALTACE) 10 MG capsule, Take 10 mg by mouth daily.  , Disp: , Rfl:  .  doxycycline (PERIOSTAT) 20 MG tablet, Take 1 tablet by mouth 2 (two) times daily. , Disp: , Rfl:   Review of Systems  Constitutional: Positive for chills, diaphoresis, fatigue and fever. Negative for activity change, appetite change and unexpected weight change.  HENT: Positive for congestion, sinus pressure, sinus pain and sore throat. Negative for ear discharge, ear pain, hearing loss, nosebleeds, postnasal drip, rhinorrhea, sneezing, tinnitus and trouble swallowing.   Eyes: Negative.   Respiratory: Positive for cough, chest tightness and shortness of breath. Negative for apnea, choking, wheezing and stridor.   Gastrointestinal: Negative.  Negative for abdominal pain, diarrhea and vomiting.  Genitourinary: Negative for dysuria.  Musculoskeletal: Positive for myalgias.  Neurological: Positive for headaches. Negative for dizziness and light-headedness.    Social History   Tobacco Use  . Smoking status: Never Smoker  . Smokeless tobacco: Never Used  Substance Use Topics  . Alcohol use: Yes    Comment: occasional glass of wine   Objective:   BP 106/70 (BP Location: Right Arm, Patient Position: Sitting, Cuff Size: Normal)   Pulse 84   Temp 100 F (37.8 C) (Oral)   Resp 16  Wt 146 lb (66.2 kg)   SpO2 95%   BMI 22.20 kg/m  Vitals:   06/02/17 1101  BP: 106/70  Pulse: 84  Resp: 16  Temp: 100 F (37.8 C)  TempSrc: Oral  SpO2: 95%  Weight: 146 lb (66.2 kg)     Physical Exam  Constitutional: She is oriented to person, place, and time. She appears well-developed and well-nourished.  HENT:  Right Ear: External ear normal.  Mouth/Throat: Posterior oropharyngeal edema and posterior oropharyngeal erythema present. No oropharyngeal exudate.  Canals erythematous bilaterally.  Eyes: Right conjunctiva is  injected. Left conjunctiva is injected.  Cardiovascular: Normal rate and regular rhythm.  Pulmonary/Chest: Effort normal and breath sounds normal.  Lymphadenopathy:    She has cervical adenopathy.  Neurological: She is alert and oriented to person, place, and time.  Skin: Skin is warm and dry.  Psychiatric: She has a normal mood and affect. Her behavior is normal.        Assessment & Plan:     1. Influenza A  Rapid flu is positive today. Counseled on 7 day duration of acute illness and several weeks of recovery after. Counseled on return precautions. Do advise to take Tamiflu due to other chronic illnesses.  - oseltamivir (TAMIFLU) 75 MG capsule; Take 1 capsule (75 mg total) by mouth 2 (two) times daily for 5 days.  Dispense: 10 capsule; Refill: 0  2. Fever, unspecified fever cause  - POCT Influenza A/B  3. Cough  - POCT Influenza A/B  Return if symptoms worsen or fail to improve.  The entirety of the information documented in the History of Present Illness, Review of Systems and Physical Exam were personally obtained by me. Portions of this information were initially documented by Ashley Royalty, CMA and reviewed by me for thoroughness and accuracy.        Trinna Post, PA-C  [facility]

## 2017-06-08 ENCOUNTER — Encounter: Payer: Self-pay | Admitting: Physician Assistant

## 2017-06-10 NOTE — Telephone Encounter (Signed)
Note completed 

## 2017-06-21 ENCOUNTER — Encounter: Payer: Self-pay | Admitting: Physician Assistant

## 2017-07-15 ENCOUNTER — Encounter: Payer: Self-pay | Admitting: Physician Assistant

## 2017-07-15 ENCOUNTER — Ambulatory Visit: Payer: BC Managed Care – PPO | Admitting: Physician Assistant

## 2017-07-15 VITALS — BP 108/72 | HR 68 | Temp 98.6°F | Resp 16 | Ht 68.0 in | Wt 146.0 lb

## 2017-07-15 DIAGNOSIS — W57XXXA Bitten or stung by nonvenomous insect and other nonvenomous arthropods, initial encounter: Secondary | ICD-10-CM | POA: Diagnosis not present

## 2017-07-15 DIAGNOSIS — L539 Erythematous condition, unspecified: Secondary | ICD-10-CM

## 2017-07-15 MED ORDER — DOXYCYCLINE HYCLATE 100 MG PO TABS: ORAL_TABLET | ORAL | 0 refills | Status: DC

## 2017-07-15 MED ORDER — TRIAMCINOLONE ACETONIDE 0.1 % EX CREA: 1.0000 "application " | TOPICAL_CREAM | Freq: Two times a day (BID) | CUTANEOUS | 0 refills | Status: DC

## 2017-07-15 MED ORDER — DOXYCYCLINE HYCLATE 100 MG PO TBEC: 100.0000 mg | DELAYED_RELEASE_TABLET | Freq: Two times a day (BID) | ORAL | 0 refills | Status: DC

## 2017-07-15 NOTE — Patient Instructions (Signed)

## 2017-07-15 NOTE — Progress Notes (Signed)
Patient: Vanessa Cohen Female    DOB: [birth year]   62 y.o.   MRN: [id number] Visit Date: 07/15/2017  Today's Provider: Trinna Post, PA-C   Chief Complaint  Patient presents with  . Tick Removal   Subjective:    HPI   Patient reports that she found a tick on her yesterday. She reports that the bite is located on the back of her right thigh. When she tried to pull the tick off, she reports that the head was still in her skin. She said she tried to take it off with tweezers, then she stuck a piece of tape over it and ripped it off. Then she sterilizer a needle and tried to dig it out. She reports that it is extremely itchy in the area where the bite is. She reports that she has a quick reaction to insect bites, but has not developed a rash yet. She thinks the head of the tick is still in there and wants somebody to take it out.       Allergies  Allergen Reactions  . Sulfa Antibiotics      Current Outpatient Medications:  .  Ascorbic Acid (VITAMIN C) 1000 MG tablet, Take 1,000 mg by mouth daily.  , Disp: , Rfl:  .  carvedilol (COREG) 25 MG tablet, Take 25 mg by mouth 2 (two) times daily.  , Disp: , Rfl:  .  cetirizine (ZYRTEC HIVES RELIEF) 10 MG tablet, Take 10 mg by mouth daily.  , Disp: , Rfl:  .  doxycycline (PERIOSTAT) 20 MG tablet, Take 1 tablet by mouth 2 (two) times daily. , Disp: , Rfl:  .  fluticasone (FLONASE) 50 MCG/ACT nasal spray, SPRAY 2 SPRAY IN EACH NOSRTIL DAILY  Pt needs to schedule an office visit before anymore refills., Disp: 48 g, Rfl: 5 .  furosemide (LASIX) 20 MG tablet, , Disp: , Rfl:  .  Omega-3 Fatty Acids (FISH OIL) 1000 MG CAPS, Take 1 capsule by mouth daily., Disp: , Rfl:  .  ramipril (ALTACE) 10 MG capsule, Take 10 mg by mouth daily.  , Disp: , Rfl:  .  aspirin 81 MG tablet, Take 81 mg by mouth daily.  , Disp: , Rfl:   Review of Systems  Constitutional: Negative for activity change, appetite change, chills, diaphoresis, fatigue, fever and  unexpected weight change.  Skin: Negative for color change, pallor and rash.    Social History   Tobacco Use  . Smoking status: Never Smoker  . Smokeless tobacco: Never Used  Substance Use Topics  . Alcohol use: Yes    Comment: occasional glass of wine   Objective:   BP 108/72   Pulse 68   Temp 98.6 F (37 C)   Resp 16   Ht 5\' 8"  (1.727 m)   Wt 146 lb (66.2 kg)   SpO2 96%   BMI 22.20 kg/m  Vitals:   07/15/17 1544  BP: 108/72  Pulse: 68  Resp: 16  Temp: 98.6 F (37 C)  SpO2: 96%  Weight: 146 lb (66.2 kg)  Height: 5\' 8"  (1.727 m)     Physical Exam  Constitutional: She is oriented to person, place, and time. She appears well-developed and well-nourished.  Cardiovascular: Normal rate.  Pulmonary/Chest: Effort normal.  Neurological: She is alert and oriented to person, place, and time.  Skin: Skin is warm and dry. There is erythema.     Psychiatric: She has a normal mood and affect. Her  behavior is normal.        Assessment & Plan:     1. Tick bite, initial encounter  Patient with tick bite in the last 24 hours, unable to identify tick head visually. No rash currently. Will prophylax with one time dose of doxycycline with instructions for watchful waiting. Do not feel need to try to remove tick head, not certain it is there and feel this would cause more pain and damage than necessary, especially with prophylactic doxycyline. Patient seems displeased with this.   - triamcinolone cream (KENALOG) 0.1 %; Apply 1 application topically 2 (two) times daily.  Dispense: 30 g; Refill: 0 - doxycycline (VIBRA-TABS) 100 MG tablet; One time dose of 200 mg.  Dispense: 2 tablet; Refill: 0  Return if symptoms worsen or fail to improve.  The entirety of the information documented in the History of Present Illness, Review of Systems and Physical Exam were personally obtained by me. Portions of this information were initially documented by Wilburt Finlay, CMA and reviewed by me  for thoroughness and accuracy.             Trinna Post, PA-C  [facility]

## 2017-07-16 ENCOUNTER — Telehealth: Payer: Self-pay | Admitting: Physician Assistant

## 2017-07-16 DIAGNOSIS — W57XXXA Bitten or stung by nonvenomous insect and other nonvenomous arthropods, initial encounter: Secondary | ICD-10-CM

## 2017-07-16 MED ORDER — DOXYCYCLINE HYCLATE 100 MG PO TABS: 100.0000 mg | ORAL_TABLET | Freq: Two times a day (BID) | ORAL | 0 refills | Status: AC

## 2017-07-16 NOTE — Telephone Encounter (Signed)
Please review. Thanks!  

## 2017-07-16 NOTE — Telephone Encounter (Signed)
Advised patient as below.  

## 2017-07-16 NOTE — Telephone Encounter (Signed)
Pt was in yesterday with a tick bite.  She was told if it got worse to call back.  She said it worse.  It has spread out more.  She uses [facility] in [place]  Her call back is [phone]  Thanks teri

## 2017-07-16 NOTE — Telephone Encounter (Signed)
Doxycycline 100 mg BID x 10 days sent in.

## 2018-07-25 ENCOUNTER — Telehealth: Payer: Self-pay

## 2018-07-25 NOTE — Telephone Encounter (Signed)
Please advise 

## 2018-07-25 NOTE — Telephone Encounter (Signed)
Pateint called office requesting that we call in prescription cream to treat for poison ivy exposure. Patient states that she was working in yard and was exposed to poison ivy. Patient reports redness, swelling and itching of her wrist. Patient states that rash has now spread to her neck, face and hands. Patient states that she uses [facility] in [place]. KW

## 2018-07-26 NOTE — Telephone Encounter (Signed)
Called pt and advised appt. Patient declined appt. Patient stated she will just continue taking Benadryl and deal with it at home.

## 2018-07-26 NOTE — Telephone Encounter (Signed)
Schedule office visit she needs to be evaluated. If she is nervous about coming in, it can be e-visit.

## 2018-07-26 NOTE — Telephone Encounter (Signed)
LMOVM for pt to cb and schedule appt.

## 2018-07-26 NOTE — Telephone Encounter (Signed)
Patient called again this morning about a prescription cream for her possible poison ivy exposure. Patient is aware that the office had closed at 4:00 PM on yesterday and Tawanna Sat will not be in until this afternoon. Patient is requesting a call back this afternoon before the office closes at 4 PM today.

## 2018-07-26 NOTE — Telephone Encounter (Signed)
Please advise 

## 2018-12-01 NOTE — Progress Notes (Signed)
Patient: Vanessa Cohen, Female    DOB: 11-01-1955, 63 y.o.   MRN: [id number] Visit Date: 12/02/2018  Today's Provider: Mar Daring, PA-C   Chief Complaint  Patient presents with  . Annual Exam   Subjective:     Annual physical exam Vanessa Cohen is a 63 y.o. female who presents today for health maintenance and complete physical. She feels well. She reports exercising, walks everyday. She reports she is sleeping fairly well. ----------------------------------------------------------------- 11/27/2015 Pap is normal, HPV negative. Will repeat in 5 years if patient desires.   Review of Systems  Constitutional: Positive for fatigue.  HENT: Negative.   Eyes: Negative.   Respiratory: Negative.   Cardiovascular: Negative.   Gastrointestinal: Negative.   Endocrine: Negative.   Genitourinary: Negative.   Musculoskeletal: Negative.   Skin: Negative.   Allergic/Immunologic: Negative.   Neurological: Negative.   Hematological: Negative.   Psychiatric/Behavioral: Negative.     Social History      She  reports that she has never smoked. She has never used smokeless tobacco. She reports current alcohol use. She reports that she does not use drugs.       Social History   Socioeconomic History  . Marital status: Single    Spouse name: Not on file  . Number of children: Not on file  . Years of education: Not on file  . Highest education level: Not on file  Occupational History  . Not on file  Social Needs  . Financial resource strain: Not on file  . Food insecurity    Worry: Not on file    Inability: Not on file  . Transportation needs    Medical: Not on file    Non-medical: Not on file  Tobacco Use  . Smoking status: Never Smoker  . Smokeless tobacco: Never Used  Substance and Sexual Activity  . Alcohol use: Yes    Comment: occasional glass of wine  . Drug use: No  . Sexual activity: Not on file  Lifestyle  . Physical activity    Days per week: Not on  file    Minutes per session: Not on file  . Stress: Not on file  Relationships  . Social Herbalist on phone: Not on file    Gets together: Not on file    Attends religious service: Not on file    Active member of club or organization: Not on file    Attends meetings of clubs or organizations: Not on file    Relationship status: Not on file  Other Topics Concern  . Not on file  Social History Narrative   Full time. Does not regularly exercise.     Past Medical History:  Diagnosis Date  . Automatic implantable cardiac defibrillator in situ   . Cancer ([place])    melanoma  . Congestive heart failure, unspecified   . Secondary cardiomyopathy, unspecified      Patient Active Problem List   Diagnosis Date Noted  . Primary osteoarthritis of both knees 11/25/2015  . Hypercholesteremia 11/25/2015  . Cardiac pacemaker in situ 01/26/2013  . Hypertension 08/26/2012  . Chronic systolic CHF (congestive heart failure) ([place]) 08/26/2012  . Allergic rhinitis 05/24/2009  . CARDIOMYOPATHY, SECONDARY 01/02/2009  . H/O malignant neoplasm of skin 03/16/2006  . Osteopenia 04/11/2003  . Disorder of skeletal muscle 03/16/1998    Past Surgical History:  Procedure Laterality Date  . BREAST BIOPSY Left    core bx- neg  Family History        Family Status  Relation Name Status  . Other  (Not Specified)  . Mother  Deceased  . Father  Deceased  . Sister  Alive  . Brother  Alive  . Sister  Alive  . Brother  Alive  . Neg Hx  (Not Specified)        Her family history includes Cancer in her father; Heart disease in her mother; Stroke in an other family member. There is no history of Breast cancer.      Allergies  Allergen Reactions  . Sulfa Antibiotics      Current Outpatient Medications:  .  carvedilol (COREG) 25 MG tablet, Take 25 mg by mouth 2 (two) times daily.  , Disp: , Rfl:  .  cetirizine (ZYRTEC HIVES RELIEF) 10 MG tablet, Take 10 mg by mouth daily.  , Disp: ,  Rfl:  .  fluticasone (FLONASE) 50 MCG/ACT nasal spray, SPRAY 2 SPRAY IN EACH NOSRTIL DAILY  Pt needs to schedule an office visit before anymore refills., Disp: 48 g, Rfl: 5 .  furosemide (LASIX) 20 MG tablet, , Disp: , Rfl:  .  ramipril (ALTACE) 10 MG capsule, Take 10 mg by mouth daily.  , Disp: , Rfl:  .  triamcinolone cream (KENALOG) 0.1 %, Apply 1 application topically 2 (two) times daily., Disp: 30 g, Rfl: 0   Patient Care Team: Mar Daring, PA-C as PCP - General (Family Medicine)    Objective:    Vitals: BP 130/77 (BP Location: Left Arm, Patient Position: Sitting, Cuff Size: Normal)   Pulse 62   Temp (!) 97.2 F (36.2 C) (Other (Comment)) Comment (Src): forehead  Resp 16   Ht 5\' 8"  (1.727 m)   Wt 145 lb 8 oz (66 kg)   BMI 22.12 kg/m    Vitals:   12/02/18 0920  BP: 130/77  Pulse: 62  Resp: 16  Temp: (!) 97.2 F (36.2 C)  TempSrc: Other (Comment)  Weight: 145 lb 8 oz (66 kg)  Height: 5\' 8"  (1.727 m)     Physical Exam Vitals signs reviewed.  Constitutional:      General: She is not in acute distress.    Appearance: Normal appearance. She is well-developed and normal weight. She is not ill-appearing or diaphoretic.  HENT:     Head: Normocephalic and atraumatic.     Right Ear: Tympanic membrane, ear canal and external ear normal.     Left Ear: Tympanic membrane, ear canal and external ear normal.     Nose: Nose normal.     Mouth/Throat:     Mouth: Mucous membranes are moist.     Pharynx: Oropharynx is clear. No oropharyngeal exudate.  Eyes:     General: No scleral icterus.       Right eye: No discharge.        Left eye: No discharge.     Extraocular Movements: Extraocular movements intact.     Conjunctiva/sclera: Conjunctivae normal.     Pupils: Pupils are equal, round, and reactive to light.  Neck:     Musculoskeletal: Normal range of motion and neck supple.     Thyroid: No thyromegaly.     Vascular: No carotid bruit or JVD.     Trachea: No tracheal  deviation.  Cardiovascular:     Rate and Rhythm: Normal rate and regular rhythm.     Pulses: Normal pulses.     Heart sounds: Normal heart sounds. No murmur.  No friction rub. No gallop.   Pulmonary:     Effort: Pulmonary effort is normal. No respiratory distress.     Breath sounds: Normal breath sounds. No wheezing or rales.  Chest:     Chest wall: No tenderness.     Breasts:        Right: Normal.        Left: Normal.  Abdominal:     General: Abdomen is flat. Bowel sounds are normal. There is no distension.     Palpations: Abdomen is soft. There is no mass.     Tenderness: There is no abdominal tenderness. There is no guarding or rebound.  Musculoskeletal: Normal range of motion.        General: No tenderness.     Right lower leg: No edema.     Left lower leg: No edema.  Lymphadenopathy:     Cervical: No cervical adenopathy.  Skin:    General: Skin is warm and dry.     Capillary Refill: Capillary refill takes less than 2 seconds.     Findings: No rash.  Neurological:     General: No focal deficit present.     Mental Status: She is alert and oriented to person, place, and time. Mental status is at baseline.  Psychiatric:        Mood and Affect: Mood normal.        Behavior: Behavior normal.        Thought Content: Thought content normal.        Judgment: Judgment normal.      Depression Screen PHQ 2/9 Scores 12/02/2018 01/15/2017  PHQ - 2 Score 0 0  PHQ- 9 Score - 0       Assessment & Plan:     Routine Health Maintenance and Physical Exam  Exercise Activities and Dietary recommendations Goals   None      There is no immunization history on file for this patient.  Health Maintenance  Topic Date Due  . TETANUS/TDAP  03/28/2015  . MAMMOGRAM  01/12/2018  . INFLUENZA VACCINE  10/15/2018  . PAP SMEAR-Modifier  11/25/2018  . COLONOSCOPY  12/06/2019  . Hepatitis C Screening  Completed  . HIV Screening  Completed     Discussed health benefits of physical  activity, and encouraged her to engage in regular exercise appropriate for her age and condition.    1. Annual physical exam Normal physical exam today. Will check labs as below and f/u pending lab results. If labs are stable and WNL she will not need to have these rechecked for one year at her next annual physical exam. She is to call the office in the meantime if she has any acute issue, questions or concerns. - CBC with Differential/Platelet - Comprehensive metabolic panel - Hemoglobin A1c - Lipid panel - TSH  2. Breast cancer screening Breast exam today was normal. There is no family history of breast cancer. She does perform regular self breast exams. Mammogram was ordered as below. Information for [facility] was given to patient so she may schedule her mammogram at her convenience. - MM 3D SCREEN BREAST BILATERAL; Future  3. Need for influenza vaccination Flu vaccine given today without complication. Patient sat upright for 15 minutes to check for adverse reaction before being released. - Flu Vaccine QUAD 36+ mos IM  4. Chronic systolic CHF (congestive heart failure) (HCC) Stable. Followed by cardiology. Diet controlled.   5. Essential hypertension Stable. Followed by cardiology. Will check labs as  below and f/u pending results. - Comprehensive metabolic panel - Hemoglobin A1c - Lipid panel  6. Hypercholesteremia Diet controlled. Will check labs as below and f/u pending results. - Comprehensive metabolic panel - Hemoglobin A1c - Lipid panel  --------------------------------------------------------------------    Mar Daring, PA-C  [facility]

## 2018-12-02 ENCOUNTER — Encounter: Payer: Self-pay | Admitting: Physician Assistant

## 2018-12-02 ENCOUNTER — Other Ambulatory Visit: Payer: Self-pay

## 2018-12-02 ENCOUNTER — Ambulatory Visit (INDEPENDENT_AMBULATORY_CARE_PROVIDER_SITE_OTHER): Payer: BC Managed Care – PPO | Admitting: Physician Assistant

## 2018-12-02 VITALS — BP 130/77 | HR 62 | Temp 97.2°F | Resp 16 | Ht 68.0 in | Wt 145.5 lb

## 2018-12-02 DIAGNOSIS — Z0000 Encounter for general adult medical examination without abnormal findings: Secondary | ICD-10-CM

## 2018-12-02 DIAGNOSIS — E7800 Pure hypercholesterolemia, unspecified: Secondary | ICD-10-CM

## 2018-12-02 DIAGNOSIS — I10 Essential (primary) hypertension: Secondary | ICD-10-CM

## 2018-12-02 DIAGNOSIS — I5022 Chronic systolic (congestive) heart failure: Secondary | ICD-10-CM | POA: Diagnosis not present

## 2018-12-02 DIAGNOSIS — Z23 Encounter for immunization: Secondary | ICD-10-CM | POA: Diagnosis not present

## 2018-12-02 DIAGNOSIS — Z1239 Encounter for other screening for malignant neoplasm of breast: Secondary | ICD-10-CM

## 2018-12-02 NOTE — Patient Instructions (Signed)

## 2018-12-03 LAB — LIPID PANEL
Chol/HDL Ratio: 3.2 ratio (ref 0.0–4.4)
Cholesterol, Total: 221 mg/dL — ABNORMAL HIGH (ref 100–199)
HDL: 69 mg/dL (ref >39)
LDL Chol Calc (NIH): 138 mg/dL — ABNORMAL HIGH (ref 0–99)
Triglycerides: 78 mg/dL (ref 0–149)
VLDL Cholesterol Cal: 14 mg/dL (ref 5–40)

## 2018-12-03 LAB — COMPREHENSIVE METABOLIC PANEL
ALT: 16 IU/L (ref 0–32)
AST: 22 IU/L (ref 0–40)
Albumin/Globulin Ratio: 2 (ref 1.2–2.2)
Albumin: 4.3 g/dL (ref 3.8–4.8)
Alkaline Phosphatase: 57 IU/L (ref 39–117)
BUN/Creatinine Ratio: 18 (ref 12–28)
BUN: 17 mg/dL (ref 8–27)
Bilirubin Total: 0.6 mg/dL (ref 0.0–1.2)
CO2: 24 mmol/L (ref 20–29)
Calcium: 9.5 mg/dL (ref 8.7–10.3)
Chloride: 105 mmol/L (ref 96–106)
Creatinine, Ser: 0.94 mg/dL (ref 0.57–1.00)
GFR calc Af Amer: 75 mL/min/{1.73_m2} (ref >59)
GFR calc non Af Amer: 65 mL/min/{1.73_m2} (ref >59)
Globulin, Total: 2.2 g/dL (ref 1.5–4.5)
Glucose: 86 mg/dL (ref 65–99)
Potassium: 4.4 mmol/L (ref 3.5–5.2)
Sodium: 143 mmol/L (ref 134–144)
Total Protein: 6.5 g/dL (ref 6.0–8.5)

## 2018-12-03 LAB — CBC WITH DIFFERENTIAL/PLATELET
Basophils Absolute: 0.1 10*3/uL (ref 0.0–0.2)
Basos: 2 %
EOS (ABSOLUTE): 0.2 10*3/uL (ref 0.0–0.4)
Eos: 5 %
Hematocrit: 40.5 % (ref 34.0–46.6)
Hemoglobin: 13.9 g/dL (ref 11.1–15.9)
Immature Grans (Abs): 0 10*3/uL (ref 0.0–0.1)
Immature Granulocytes: 0 %
Lymphocytes Absolute: 1.5 10*3/uL (ref 0.7–3.1)
Lymphs: 36 %
MCH: 31.2 pg (ref 26.6–33.0)
MCHC: 34.3 g/dL (ref 31.5–35.7)
MCV: 91 fL (ref 79–97)
Monocytes Absolute: 0.4 10*3/uL (ref 0.1–0.9)
Monocytes: 8 %
Neutrophils Absolute: 2.1 10*3/uL (ref 1.4–7.0)
Neutrophils: 49 %
Platelets: 189 10*3/uL (ref 150–450)
RBC: 4.45 x10E6/uL (ref 3.77–5.28)
RDW: 12.6 % (ref 11.7–15.4)
WBC: 4.3 10*3/uL (ref 3.4–10.8)

## 2018-12-03 LAB — HEMOGLOBIN A1C
Est. average glucose Bld gHb Est-mCnc: 111 mg/dL
Hgb A1c MFr Bld: 5.5 % (ref 4.8–5.6)

## 2018-12-03 LAB — TSH: TSH: 0.955 u[IU]/mL (ref 0.450–4.500)

## 2018-12-05 ENCOUNTER — Telehealth: Payer: Self-pay

## 2018-12-05 NOTE — Telephone Encounter (Signed)
-----   Message from Mar Daring, [place] sent at 12/05/2018  8:50 AM EDT ----- Blood count is normal. Kidney and liver function are normal. Sodium, potassium and calcium are normal. Sugar/A1c is normal. Cholesterol is stable but remains just borderline high. Currently risk of having a cardiovascular event over the next 10 years is borderline high at 5.3%. Would recommend considering cholesterol lowering medication. Can also work on limiting cholesterol (saturated fats) in diet and using fish oil or krill oil OTC to boost good cholesterol. Thyroid is normal.

## 2018-12-05 NOTE — Telephone Encounter (Signed)
Viewed by Chyrl Civatte on 12/05/2018 9:10 AM Written by Mar Daring, PA-C on 12/05/2018 8:50 AM Blood count is normal. Kidney and liver function are normal. Sodium, potassium and calcium are normal. Sugar/A1c is normal. Cholesterol is stable but remains just borderline high. Currently risk of having a cardiovascular event over the next 10 years is borderline high at 5.3%. Would recommend considering cholesterol lowering medication. Can also work on limiting cholesterol (saturated fats) in diet and using fish oil or krill oil OTC to boost good cholesterol. Thyroid is normal.

## 2018-12-06 ENCOUNTER — Encounter: Payer: Self-pay | Admitting: Physician Assistant

## 2018-12-19 ENCOUNTER — Encounter (INDEPENDENT_AMBULATORY_CARE_PROVIDER_SITE_OTHER): Payer: BC Managed Care – PPO | Admitting: Physician Assistant

## 2018-12-19 DIAGNOSIS — W57XXXA Bitten or stung by nonvenomous insect and other nonvenomous arthropods, initial encounter: Secondary | ICD-10-CM

## 2018-12-19 DIAGNOSIS — S00461A Insect bite (nonvenomous) of right ear, initial encounter: Secondary | ICD-10-CM

## 2018-12-20 MED ORDER — CEPHALEXIN 500 MG PO CAPS: 500.0000 mg | ORAL_CAPSULE | Freq: Two times a day (BID) | ORAL | 0 refills | Status: DC

## 2018-12-20 NOTE — Telephone Encounter (Signed)
Virtual Visit via Video Note  I connected with Colbi Jariwala on 12/20/2018 by a  telemedicine application and verified that I am speaking with the correct person using two identifiers.  Location: Patient: Home Provider: Home   I discussed the limitations of evaluation and management by telemedicine and the availability of in person appointments. The patient expressed understanding and agreed to proceed.  History of Present Illness: Vanessa Cohen is a 63 yr old female that contacted the office via mychart for a "insect bite" on the right ear. She has been using warm compresses and benadryl without relief.    Observations/Objective: See photos attached in mychart visit of right ear.   Assessment and Plan: 1. Insect bite of right ear, initial encounter Keflex will be given as below for surrounding infection/cellulitis of the bite on the right ear.  - cephALEXin (KEFLEX) 500 MG capsule; Take 1 capsule (500 mg total) by mouth 2 (two) times daily.  Dispense: 14 capsule; Refill: 0   Follow Up Instructions: Call if worsening for in office evaluation.    I discussed the assessment and treatment plan with the patient. The patient was provided an opportunity to ask questions and all were answered. The patient agreed with the plan and demonstrated an understanding of the instructions.   The patient was advised to call back or seek an in-person evaluation if the symptoms worsen or if the condition fails to improve as anticipated.  I provided 7 minutes of non-face-to-face time during this encounter.  Over 50% of the time was reviewing the patient's chart, medications and allergies.    Mar Daring, PA-C

## 2019-01-02 ENCOUNTER — Other Ambulatory Visit: Payer: Self-pay

## 2019-01-02 ENCOUNTER — Telehealth: Payer: Self-pay

## 2019-01-02 ENCOUNTER — Ambulatory Visit
Admission: RE | Admit: 2019-01-02 | Discharge: 2019-01-02 | Disposition: A | Discharge status: Home / Self Care | Payer: BC Managed Care – PPO | Source: Ambulatory Visit | Attending: Physician Assistant | Admitting: Physician Assistant

## 2019-01-02 DIAGNOSIS — Z1239 Encounter for other screening for malignant neoplasm of breast: Secondary | ICD-10-CM

## 2019-01-02 DIAGNOSIS — Z1231 Encounter for screening mammogram for malignant neoplasm of breast: Secondary | ICD-10-CM | POA: Diagnosis not present

## 2019-01-02 NOTE — Telephone Encounter (Signed)
-----   Message from Mar Daring, [place] sent at 01/02/2019  4:41 PM EDT ----- Normal mammogram. Repeat screening in one year.

## 2019-01-02 NOTE — Telephone Encounter (Signed)
Patient advised as below.  

## 2019-05-18 ENCOUNTER — Other Ambulatory Visit: Payer: Self-pay

## 2019-05-18 ENCOUNTER — Encounter: Payer: Self-pay | Admitting: Physician Assistant

## 2019-05-18 ENCOUNTER — Ambulatory Visit (INDEPENDENT_AMBULATORY_CARE_PROVIDER_SITE_OTHER): Payer: BC Managed Care – PPO | Admitting: Physician Assistant

## 2019-05-18 DIAGNOSIS — J301 Allergic rhinitis due to pollen: Secondary | ICD-10-CM

## 2019-05-18 DIAGNOSIS — H1013 Acute atopic conjunctivitis, bilateral: Secondary | ICD-10-CM | POA: Diagnosis not present

## 2019-05-18 MED ORDER — AZELASTINE HCL 0.05 % OP SOLN: 1.0000 [drp] | Freq: Two times a day (BID) | OPHTHALMIC | 12 refills | Status: DC

## 2019-05-18 MED ORDER — FLUTICASONE PROPIONATE 50 MCG/ACT NA SUSP: NASAL | 5 refills | Status: DC

## 2019-05-18 NOTE — Patient Instructions (Signed)
Azelastine eye solution What is this medicine? AZELASTINE (a ZEL as teen) is an antihistamine. It is used in the eye to treat itching of eyes caused by hay fever or other allergies. This medicine may be used for other purposes; ask your health care provider or pharmacist if you have questions. COMMON BRAND NAME(S): Optivar What should I tell my health care provider before I take this medicine? They need to know if you have any of these conditions:  wear contact lenses  an unusual or allergic reaction to azelastine, other medicines, foods, dyes, or preservatives  pregnant or trying to become pregnant  breast-feeding How should I use this medicine? This medicine is only for use in the eye. Do not take by mouth. Follow the directions on the prescription label. Wash hands before and after use. Tilt the head back slightly and pull down the lower eyelid with your index finger to form a pouch. Try not to touch the tip of the dropper to your eye, fingertips, or any other surface. Squeeze the prescribed number of drops (usually 1 drop) into the pouch. Close the eye gently. Do not blink. Use your doses at regular intervals. Do not use your medicine more often than directed. If you use other eye medicines, they should be used at least 10 minutes before or after this medicine. Eye ointments should be applied last. Talk to your pediatrician regarding the use of this medicine in children. Special care may be needed. While this drug may be prescribed for children as young as 72 years of age for selected conditions, precautions do apply. Overdosage: If you think you have taken too much of this medicine contact a poison control center or emergency room at once. NOTE: This medicine is only for you. Do not share this medicine with others. What if I miss a dose? If you miss a dose, use it as soon as you can. If it is almost time for your next dose, use only that dose. Do not use double or extra doses. What may  interact with this medicine? Interactions are not expected. Do not use any other eye products without telling your doctor or health care professional. This list may not describe all possible interactions. Give your health care provider a list of all the medicines, herbs, non-prescription drugs, or dietary supplements you use. Also tell them if you smoke, drink alcohol, or use illegal drugs. Some items may interact with your medicine. What should I watch for while using this medicine? Tell your doctor or health care professional if your symptoms do not start to get better within 2 or 3 days. Report any serious side effects promptly. Stop using this medicine if your eyes get swollen, painful, or have a discharge, and see your doctor or health care professional as soon as you can. Contact lenses may be inserted 10 minutes after putting the medicine in the eye. Do not wear contact lenses if your eyes are red. You should not use this medicine to treat irritation that is caused by contact lenses. What side effects may I notice from receiving this medicine? Side effects that you should report to your doctor or health care professional as soon as possible:  allergic reactions like skin rash, itching or hives, swelling of the face, lips, or tongue Side effects that usually do not require medical attention (report to your doctor or health care professional if they continue or are bothersome):  bitter taste  blurred vision  eye pain, burning or stinging  fatigue  headache This list may not describe all possible side effects. Call your doctor for medical advice about side effects. You may report side effects to FDA at 1-800-FDA-1088. Where should I keep my medicine? Keep out of the reach of children. Keep container tightly closed when not in use. Store upright between 2 and 25 degrees C (36 to 77 degrees F). Throw away any unused medicine after the expiration date. NOTE: This sheet is a summary. It may  not cover all possible information. If you have questions about this medicine, talk to your doctor, pharmacist, or health care provider.  2020 Elsevier/Gold Standard (2007-05-16 14:29:55)

## 2019-05-18 NOTE — Progress Notes (Signed)
Patient: Vanessa Cohen Female    DOB: April 22, 1955   64 y.o.   MRN: [id number] Visit Date: 05/18/2019  Today's Provider: Mar Daring, PA-C   No chief complaint on file.  Subjective:    Virtual Visit via Telephone Note  I connected with Loral Budden on 05/18/19 at  5:40 PM EST by telephone and verified that I am speaking with the correct person using two identifiers.  Location: Patient: Home Provider: [facility]    I discussed the limitations, risks, security and privacy concerns of performing an evaluation and management service by telephone and the availability of in person appointments. I also discussed with the patient that there may be a patient responsible charge related to this service. The patient expressed understanding and agreed to proceed.  Sinus Problem This is a new problem. The current episode started 1 to 4 weeks ago. The problem has been gradually worsening since onset. There has been no fever. She is experiencing no pain. Associated symptoms include congestion, headaches, sinus pressure and sneezing. Pertinent negatives include no chills, coughing, ear pain, hoarse voice, neck pain, shortness of breath, sore throat or swollen glands. Treatments tried: zyrtec. The treatment provided mild relief.    Patient has seasonal allergies. Reports when they worsen she will start using flonase as well as zyrtec. She is also having eye itching and drainage as well.    Allergies  Allergen Reactions  . Sulfa Antibiotics      Current Outpatient Medications:  .  carvedilol (COREG) 25 MG tablet, Take 25 mg by mouth 2 (two) times daily.  , Disp: , Rfl:  .  cephALEXin (KEFLEX) 500 MG capsule, Take 1 capsule (500 mg total) by mouth 2 (two) times daily., Disp: 14 capsule, Rfl: 0 .  cetirizine (ZYRTEC HIVES RELIEF) 10 MG tablet, Take 10 mg by mouth daily.  , Disp: , Rfl:  .  fluticasone (FLONASE) 50 MCG/ACT nasal spray, SPRAY 2 SPRAY IN EACH NOSRTIL DAILY  Pt needs to schedule  an office visit before anymore refills., Disp: 48 g, Rfl: 5 .  furosemide (LASIX) 20 MG tablet, , Disp: , Rfl:  .  ramipril (ALTACE) 10 MG capsule, Take 10 mg by mouth daily.  , Disp: , Rfl:  .  triamcinolone cream (KENALOG) 0.1 %, Apply 1 application topically 2 (two) times daily., Disp: 30 g, Rfl: 0  Review of Systems  Constitutional: Negative for chills and fever.  HENT: Positive for congestion, postnasal drip, rhinorrhea, sinus pressure and sneezing. Negative for ear pain, hoarse voice and sore throat.   Eyes: Positive for discharge and itching.  Respiratory: Negative for cough, chest tightness and shortness of breath.   Cardiovascular: Negative for chest pain, palpitations and leg swelling.  Gastrointestinal: Negative for abdominal pain and nausea.  Musculoskeletal: Negative for neck pain.  Neurological: Positive for headaches. Negative for dizziness.    Social History   Tobacco Use  . Smoking status: Never Smoker  . Smokeless tobacco: Never Used  Substance Use Topics  . Alcohol use: Yes    Comment: occasional glass of wine      Objective:   There were no vitals taken for this visit. There were no vitals filed for this visit.There is no height or weight on file to calculate BMI.   Physical Exam Vitals reviewed.  Constitutional:      General: She is not in acute distress. Pulmonary:     Effort: No respiratory distress.  Neurological:  Mental Status: She is alert.      No results found for any visits on 05/18/19.     Assessment & Plan    1. Seasonal allergic rhinitis due to pollen Continue Cetirizine. Restart Flonase. Saline nasal rinses can help also. Call if worsening.  - fluticasone (FLONASE) 50 MCG/ACT nasal spray; SPRAY 2 SPRAY IN EACH NOSRTIL DAILY  Dispense: 48 g; Refill: 5  2. Allergic conjunctivitis of both eyes Noted by patient to have symptoms c/w allergic conjunctivitis. Will start Azelastine eye drops as below.  - azelastine (OPTIVAR) 0.05 %  ophthalmic solution; Place 1 drop into both eyes 2 (two) times daily.  Dispense: 6 mL; Refill: 12    I discussed the assessment and treatment plan with the patient. The patient was provided an opportunity to ask questions and all were answered. The patient agreed with the plan and demonstrated an understanding of the instructions.   The patient was advised to call back or seek an in-person evaluation if the symptoms worsen or if the condition fails to improve as anticipated.  I provided 7 minutes of non-face-to-face time during this encounter.    Mar Daring, PA-C  [facility]

## 2019-09-21 ENCOUNTER — Ambulatory Visit: Payer: BC Managed Care – PPO | Admitting: Physician Assistant

## 2019-09-21 ENCOUNTER — Encounter: Payer: Self-pay | Admitting: Physician Assistant

## 2019-09-21 ENCOUNTER — Other Ambulatory Visit: Payer: Self-pay

## 2019-09-21 VITALS — BP 127/71 | HR 62 | Temp 98.5°F | Resp 16 | Wt 146.0 lb

## 2019-09-21 DIAGNOSIS — Z91038 Other insect allergy status: Secondary | ICD-10-CM

## 2019-09-21 DIAGNOSIS — T7840XA Allergy, unspecified, initial encounter: Secondary | ICD-10-CM | POA: Insufficient documentation

## 2019-09-21 MED ORDER — PREDNISONE 10 MG PO TABS: ORAL_TABLET | ORAL | 0 refills | Status: DC

## 2019-09-21 MED ORDER — METHYLPREDNISOLONE ACETATE 40 MG/ML IJ SUSP
40.0000 mg | Freq: Once | INTRAMUSCULAR | Status: AC
  Administered 2019-09-21: 40 mg via INTRAMUSCULAR

## 2019-09-21 NOTE — Patient Instructions (Addendum)
Continue taking Benadryl. Recommend taking Pepcid twice daily.  Call if your symptoms worsen or new symptoms develop.

## 2019-09-21 NOTE — Progress Notes (Signed)
I,Roshena L Gaumer,acting as a scribe for [employer], PA-C.,have documented all relevant documentation on the behalf of Mar Daring, PA-C,as directed by  Mar Daring, PA-C while in the presence of Mar Daring, [place].   Established patient visit   Patient: Vanessa Cohen   DOB: Jul 28, 1955   64 y.o. Female  MRN: [id number] Visit Date: 09/21/2019  Today's healthcare provider: Mar Daring, PA-C   Chief Complaint  Patient presents with  . Eye Problem   Subjective    Eye Problem  The right eye is affected. This is a new problem. The current episode started yesterday. The problem has been gradually worsening. Injury mechanism: bitten by an insect on the left side of her neck and right eye. Associated symptoms include eye redness. Pertinent negatives include no blurred vision, fever, nausea, vomiting or weakness. Associated symptoms comments: Also swelling . Treatments tried: Benadryl. The treatment provided no relief.     Patient Active Problem List   Diagnosis Date Noted  . Allergic reaction 09/21/2019  . Primary osteoarthritis of both knees 11/25/2015  . Hypercholesteremia 11/25/2015  . Cardiac pacemaker in situ 01/26/2013  . Hypertension 08/26/2012  . Chronic systolic CHF (congestive heart failure) ([place]) 08/26/2012  . Allergic rhinitis 05/24/2009  . CARDIOMYOPATHY, SECONDARY 01/02/2009  . H/O malignant neoplasm of skin 03/16/2006  . Osteopenia 04/11/2003  . Disorder of skeletal muscle 03/16/1998   Past Medical History:  Diagnosis Date  . Automatic implantable cardiac defibrillator in situ   . Cancer ([place])    melanoma  . Congestive heart failure, unspecified   . Secondary cardiomyopathy, unspecified        Medications: Outpatient Medications Prior to Visit  Medication Sig  . azelastine (OPTIVAR) 0.05 % ophthalmic solution Place 1 drop into both eyes 2 (two) times daily.  . carvedilol (COREG) 25 MG tablet Take 25 mg by mouth 2  (two) times daily.    . cetirizine (ZYRTEC HIVES RELIEF) 10 MG tablet Take 10 mg by mouth daily.    . fluticasone (FLONASE) 50 MCG/ACT nasal spray SPRAY 2 SPRAY IN EACH NOSRTIL DAILY  . furosemide (LASIX) 20 MG tablet   . Multiple Vitamin (MULTIVITAMIN ADULT PO) Take 1 tablet by mouth daily.  . ramipril (ALTACE) 10 MG capsule Take 10 mg by mouth daily.    Marland Kitchen triamcinolone cream (KENALOG) 0.1 % Apply 1 application topically 2 (two) times daily.   No facility-administered medications prior to visit.    Review of Systems  Constitutional: Negative for appetite change, chills, fatigue and fever.  Eyes: Positive for pain and redness. Negative for blurred vision.  Respiratory: Negative for chest tightness and shortness of breath.   Cardiovascular: Negative for chest pain and palpitations.  Gastrointestinal: Negative for abdominal pain, nausea and vomiting.  Neurological: Negative for dizziness and weakness.    Last CBC Lab Results  Component Value Date   WBC 4.3 12/02/2018   HGB 13.9 12/02/2018   HCT 40.5 12/02/2018   MCV 91 12/02/2018   MCH 31.2 12/02/2018   RDW 12.6 12/02/2018   PLT 189 93/57/0177   Last metabolic panel Lab Results  Component Value Date   GLUCOSE 86 12/02/2018   NA 143 12/02/2018   K 4.4 12/02/2018   CL 105 12/02/2018   CO2 24 12/02/2018   BUN 17 12/02/2018   CREATININE 0.94 12/02/2018   GFRNONAA 65 12/02/2018   GFRAA 75 12/02/2018   CALCIUM 9.5 12/02/2018   PROT 6.5 12/02/2018  ALBUMIN 4.3 12/02/2018   LABGLOB 2.2 12/02/2018   AGRATIO 2.0 12/02/2018   BILITOT 0.6 12/02/2018   ALKPHOS 57 12/02/2018   AST 22 12/02/2018   ALT 16 12/02/2018      Objective    BP 127/71 (BP Location: Left Arm, Patient Position: Sitting, Cuff Size: Normal)   Pulse 62   Temp 98.5 F (36.9 C) (Oral)   Resp 16   Wt 146 lb (66.2 kg)   BMI 22.20 kg/m  BP Readings from Last 3 Encounters:  09/21/19 127/71  12/02/18 130/77  07/15/17 108/72   Wt Readings from Last 3  Encounters:  09/21/19 146 lb (66.2 kg)  12/02/18 145 lb 8 oz (66 kg)  07/15/17 146 lb (66.2 kg)      Physical Exam Vitals reviewed. Exam conducted with a chaperone present.  Constitutional:      General: She is not in acute distress.    Appearance: Normal appearance. She is normal weight. She is not ill-appearing.  HENT:     Head: Normocephalic and atraumatic.   Eyes:     General: No visual field deficit.       Right eye: No foreign body, discharge or hordeolum.        Left eye: No foreign body, discharge or hordeolum.     Extraocular Movements: Extraocular movements intact.     Conjunctiva/sclera: Conjunctivae normal.     Pupils: Pupils are equal, round, and reactive to light.  Neurological:     Mental Status: She is alert.       No results found for any visits on 09/21/19.  Assessment & Plan     1. Allergic reaction, initial encounter New. Suspect allergic reaction from [place] Medrol 80mg  IM given today. Continue benadryl 25mg  prn. May add pepcid for H2 blocker. Prednisone 6 day taper will be started tomorrow. Continue cold compresses. Call if any changes occur.  - methylPREDNISolone acetate (DEPO-MEDROL) injection 40 mg - methylPREDNISolone acetate (DEPO-MEDROL) injection 40 mg - predniSONE (DELTASONE) 10 MG tablet; 6 day taper. Take as directed on instructions  Dispense: 21 tablet; Refill: 0  No follow-ups on file.      Reynolds Bowl, PA-C, have reviewed all documentation for this visit. The documentation on 09/21/19 for the exam, diagnosis, procedures, and orders are all accurate and complete.   Rubye Beach  [facility] [phone] (phone) [phone] (fax)  [place]

## 2019-12-27 ENCOUNTER — Other Ambulatory Visit: Payer: Self-pay | Admitting: Physician Assistant

## 2019-12-27 DIAGNOSIS — Z1231 Encounter for screening mammogram for malignant neoplasm of breast: Secondary | ICD-10-CM

## 2020-01-10 ENCOUNTER — Other Ambulatory Visit: Payer: Self-pay

## 2020-01-10 ENCOUNTER — Ambulatory Visit
Admission: RE | Admit: 2020-01-10 | Discharge: 2020-01-10 | Disposition: A | Discharge status: Home / Self Care | Payer: BC Managed Care – PPO | Source: Ambulatory Visit | Attending: Physician Assistant | Admitting: Physician Assistant

## 2020-01-10 DIAGNOSIS — Z1231 Encounter for screening mammogram for malignant neoplasm of breast: Secondary | ICD-10-CM | POA: Insufficient documentation

## 2020-01-10 HISTORY — DX: Basal cell carcinoma of skin, unspecified: C44.91

## 2020-01-24 ENCOUNTER — Ambulatory Visit (INDEPENDENT_AMBULATORY_CARE_PROVIDER_SITE_OTHER): Payer: BC Managed Care – PPO | Admitting: Physician Assistant

## 2020-01-24 ENCOUNTER — Encounter: Payer: Self-pay | Admitting: Physician Assistant

## 2020-01-24 ENCOUNTER — Other Ambulatory Visit: Payer: Self-pay

## 2020-01-24 VITALS — BP 96/65 | HR 70 | Temp 97.2°F | Resp 16 | Wt 149.4 lb

## 2020-01-24 DIAGNOSIS — Z1211 Encounter for screening for malignant neoplasm of colon: Secondary | ICD-10-CM | POA: Diagnosis not present

## 2020-01-24 DIAGNOSIS — Z0000 Encounter for general adult medical examination without abnormal findings: Secondary | ICD-10-CM

## 2020-01-24 DIAGNOSIS — I10 Essential (primary) hypertension: Secondary | ICD-10-CM | POA: Diagnosis not present

## 2020-01-24 DIAGNOSIS — E7800 Pure hypercholesterolemia, unspecified: Secondary | ICD-10-CM

## 2020-01-24 DIAGNOSIS — I5022 Chronic systolic (congestive) heart failure: Secondary | ICD-10-CM

## 2020-01-24 NOTE — Progress Notes (Signed)
Complete physical exam   Patient: Vanessa Cohen   DOB: 08-06-1955   63 y.o. Female  MRN: [id number] Visit Date: 01/24/2020  Today's healthcare provider: Mar Daring, PA-C   Chief Complaint  Patient presents with  . Annual Exam   Subjective    Vanessa Cohen is a 64 y.o. female who presents today for a complete physical exam.  She reports consuming a Well Balanced diet. Home exercise routine includes walks daily and does yard work.. She generally feels well. She reports sleeping fairly well. She does not have additional problems to discuss today.  HPI   11/27/2015 -Pap is normal, HPV negative. Will repeat in 5 years if patient desires. 01/11/20-Mammogram-Normal  Declined Influenza and Covid-19 vaccine.  Past Medical History:  Diagnosis Date  . Automatic implantable cardiac defibrillator in situ   . Basal cell carcinoma   . Cancer ([place])    melanoma  . Congestive heart failure, unspecified   . Secondary cardiomyopathy, unspecified    Past Surgical History:  Procedure Laterality Date  . BREAST BIOPSY Left    core bx- neg   Social History   Socioeconomic History  . Marital status: Single    Spouse name: Not on file  . Number of children: Not on file  . Years of education: Not on file  . Highest education level: Not on file  Occupational History  . Not on file  Tobacco Use  . Smoking status: Never Smoker  . Smokeless tobacco: Never Used  Vaping Use  . Vaping Use: Never used  Substance and Sexual Activity  . Alcohol use: Yes    Comment: occasional glass of wine  . Drug use: No  . Sexual activity: Not on file  Other Topics Concern  . Not on file  Social History Narrative   Full time. Does not regularly exercise.    Social Determinants of Health   Financial Resource Strain:   . Difficulty of Paying Living Expenses: Not on file  Food Insecurity:   . Worried About Charity fundraiser in the Last Year: Not on file  . Ran Out of Food in the Last  Year: Not on file  Transportation Needs:   . Lack of Transportation (Medical): Not on file  . Lack of Transportation (Non-Medical): Not on file  Physical Activity:   . Days of Exercise per Week: Not on file  . Minutes of Exercise per Session: Not on file  Stress:   . Feeling of Stress : Not on file  Social Connections:   . Frequency of Communication with Friends and Family: Not on file  . Frequency of Social Gatherings with Friends and Family: Not on file  . Attends Religious Services: Not on file  . Active Member of Clubs or Organizations: Not on file  . Attends Archivist Meetings: Not on file  . Marital Status: Not on file  Intimate Partner Violence:   . Fear of Current or Ex-Partner: Not on file  . Emotionally Abused: Not on file  . Physically Abused: Not on file  . Sexually Abused: Not on file   Family Status  Relation Name Status  . Other  (Not Specified)  . Mother  Deceased  . Father  Deceased  . Sister  Alive  . Brother  Alive  . Sister  Alive  . Brother  Alive  . Neg Hx  (Not Specified)   Family History  Problem Relation Age of Onset  . Stroke Other   .  Heart disease Mother   . Cancer Father        lung cancer  . Breast cancer Neg Hx    Allergies  Allergen Reactions  . Sulfa Antibiotics     Patient Care Team: Mar Daring, PA-C as PCP - General (Family Medicine)   Medications: Outpatient Medications Prior to Visit  Medication Sig  . azelastine (OPTIVAR) 0.05 % ophthalmic solution Place 1 drop into both eyes 2 (two) times daily.  . carvedilol (COREG) 25 MG tablet Take 25 mg by mouth 2 (two) times daily.    . cetirizine (ZYRTEC HIVES RELIEF) 10 MG tablet Take 10 mg by mouth daily.    . fluticasone (FLONASE) 50 MCG/ACT nasal spray SPRAY 2 SPRAY IN EACH NOSRTIL DAILY  . furosemide (LASIX) 20 MG tablet   . Multiple Vitamin (MULTIVITAMIN ADULT PO) Take 1 tablet by mouth daily.  . ramipril (ALTACE) 10 MG capsule Take 10 mg by mouth daily.     . [DISCONTINUED] predniSONE (DELTASONE) 10 MG tablet 6 day taper. Take as directed on instructions  . [DISCONTINUED] triamcinolone cream (KENALOG) 0.1 % Apply 1 application topically 2 (two) times daily.   No facility-administered medications prior to visit.    Review of Systems  Constitutional: Negative.   HENT: Negative.   Eyes: Negative.   Respiratory: Negative.   Cardiovascular: Negative.   Gastrointestinal: Negative.   Endocrine: Negative.   Genitourinary: Negative.   Musculoskeletal: Negative.   Skin: Negative.   Allergic/Immunologic: Negative.   Neurological: Negative.   Hematological: Negative.   Psychiatric/Behavioral: Negative.     Last CBC Lab Results  Component Value Date   WBC 4.3 12/02/2018   HGB 13.9 12/02/2018   HCT 40.5 12/02/2018   MCV 91 12/02/2018   MCH 31.2 12/02/2018   RDW 12.6 12/02/2018   PLT 189 37/16/9678   Last metabolic panel Lab Results  Component Value Date   GLUCOSE 86 12/02/2018   NA 143 12/02/2018   K 4.4 12/02/2018   CL 105 12/02/2018   CO2 24 12/02/2018   BUN 17 12/02/2018   CREATININE 0.94 12/02/2018   GFRNONAA 65 12/02/2018   GFRAA 75 12/02/2018   CALCIUM 9.5 12/02/2018   PROT 6.5 12/02/2018   ALBUMIN 4.3 12/02/2018   LABGLOB 2.2 12/02/2018   AGRATIO 2.0 12/02/2018   BILITOT 0.6 12/02/2018   ALKPHOS 57 12/02/2018   AST 22 12/02/2018   ALT 16 12/02/2018      Objective    BP 96/65 (BP Location: Right Arm, Patient Position: Sitting, Cuff Size: Large)   Pulse 70   Temp (!) 97.2 F (36.2 C) (Oral)   Resp 16   Wt 149 lb 6.4 oz (67.8 kg)   BMI 22.72 kg/m  BP Readings from Last 3 Encounters:  01/24/20 96/65  09/21/19 127/71  12/02/18 130/77   Wt Readings from Last 3 Encounters:  01/24/20 149 lb 6.4 oz (67.8 kg)  09/21/19 146 lb (66.2 kg)  12/02/18 145 lb 8 oz (66 kg)      Physical Exam Vitals reviewed.  Constitutional:      General: She is not in acute distress.    Appearance: Normal appearance. She is  well-developed and normal weight. She is not ill-appearing or diaphoretic.  HENT:     Head: Normocephalic and atraumatic.     Right Ear: Tympanic membrane, ear canal and external ear normal.     Left Ear: Tympanic membrane, ear canal and external ear normal.     Nose: Nose  normal.     Mouth/Throat:     Mouth: Mucous membranes are moist.     Pharynx: Oropharynx is clear. No oropharyngeal exudate or posterior oropharyngeal erythema.  Eyes:     General: No scleral icterus.       Right eye: No discharge.        Left eye: No discharge.     Extraocular Movements: Extraocular movements intact.     Conjunctiva/sclera: Conjunctivae normal.     Pupils: Pupils are equal, round, and reactive to light.  Neck:     Thyroid: No thyromegaly.     Vascular: No carotid bruit or JVD.     Trachea: No tracheal deviation.  Cardiovascular:     Rate and Rhythm: Normal rate and regular rhythm.     Pulses: Normal pulses.     Heart sounds: Normal heart sounds. No murmur heard.  No friction rub. No gallop.   Pulmonary:     Effort: Pulmonary effort is normal. No respiratory distress.     Breath sounds: Normal breath sounds. No wheezing or rales.  Chest:     Chest wall: No tenderness.  Abdominal:     General: Abdomen is flat. Bowel sounds are normal. There is no distension.     Palpations: Abdomen is soft. There is no mass.     Tenderness: There is no abdominal tenderness. There is no guarding or rebound.  Musculoskeletal:        General: No tenderness. Normal range of motion.     Cervical back: Normal range of motion and neck supple. No tenderness.     Right lower leg: No edema.     Left lower leg: No edema.  Lymphadenopathy:     Cervical: No cervical adenopathy.  Skin:    General: Skin is warm and dry.     Capillary Refill: Capillary refill takes less than 2 seconds.     Findings: No rash.  Neurological:     General: No focal deficit present.     Mental Status: She is alert and oriented to person,  place, and time. Mental status is at baseline.  Psychiatric:        Mood and Affect: Mood normal.        Behavior: Behavior normal.        Thought Content: Thought content normal.        Judgment: Judgment normal.     Last depression screening scores PHQ 2/9 Scores 01/24/2020 12/02/2018 01/15/2017  PHQ - 2 Score 0 0 0  PHQ- 9 Score - - 0   Last fall risk screening Fall Risk  12/02/2018  Falls in the past year? 0  Number falls in past yr: 0  Injury with Fall? 0   Last Audit-C alcohol use screening Alcohol Use Disorder Test (AUDIT) 01/24/2020  1. How often do you have a drink containing alcohol? 2  2. How many drinks containing alcohol do you have on a typical day when you are drinking? 0  3. How often do you have six or more drinks on one occasion? 0  AUDIT-C Score 2  Alcohol Brief Interventions/Follow-up AUDIT Score <7 follow-up not indicated   A score of 3 or more in women, and 4 or more in men indicates increased risk for alcohol abuse, EXCEPT if all of the points are from question 1   No results found for any visits on 01/24/20.  Assessment & Plan    Routine Health Maintenance and Physical Exam  Exercise Activities and Dietary  recommendations Goals   None     Immunization History  Administered Date(s) Administered  . Hepatitis B 05/30/1992, 07/04/1992, 01/02/1993  . Influenza Split 01/03/2013  . Influenza,inj,Quad PF,6+ Mos 12/02/2018  . Influenza-Unspecified 12/02/2018  . MMR 04/18/2010  . PPD Test 02/22/2007, 01/29/2014  . Pneumococcal Polysaccharide-23 04/08/2013  . Td 03/27/2005  . Tdap 05/05/2010    Health Maintenance  Topic Date Due  . COVID-19 Vaccine (1) Never done  . COLONOSCOPY  12/06/2019  . INFLUENZA VACCINE  06/13/2020 (Originally 10/15/2019)  . TETANUS/TDAP  05/05/2020  . PAP SMEAR-Modifier  11/24/2020  . MAMMOGRAM  01/09/2021  . Hepatitis C Screening  Completed  . HIV Screening  Completed    Discussed health benefits of physical activity,  and encouraged her to engage in regular exercise appropriate for her age and condition.  1. Annual physical exam Normal physical exam today. Will check labs as below and f/u pending lab results. If labs are stable and WNL she will not need to have these rechecked for one year at her next annual physical exam. She is to call the office in the meantime if she has any acute issue, questions or concerns. - CBC with Differential/Platelet - Comprehensive metabolic panel - Hemoglobin A1c - Lipid panel - TSH  2. Colon cancer screening Due for colonoscopy. Referral placed.  - Ambulatory referral to Gastroenterology  3. Chronic systolic CHF (congestive heart failure) ([facility]) Stable. No issues. Followed by Cardiology, Dr. Margarito Courser. Will check labs as below and f/u pending results. - Comprehensive metabolic panel  4. Primary hypertension Stable. Followed by Cardiology. Will check labs as below and f/u pending results. - Comprehensive metabolic panel - Hemoglobin A1c - Lipid panel - TSH  5. Hypercholesteremia Stable. Diet controlled. Will check labs as below and f/u pending results. - Comprehensive metabolic panel - Hemoglobin A1c - Lipid panel - TSH  Return in about 1 year (around 01/23/2021).     Reynolds Bowl, PA-C, have reviewed all documentation for this visit. The documentation on 01/29/20 for the exam, diagnosis, procedures, and orders are all accurate and complete.   Rubye Beach  [facility] [phone] (phone) [phone] (fax)  [place]

## 2020-01-24 NOTE — Patient Instructions (Signed)

## 2020-01-31 LAB — LIPID PANEL
Chol/HDL Ratio: 3.8 ratio (ref 0.0–4.4)
Cholesterol, Total: 271 mg/dL — ABNORMAL HIGH (ref 100–199)
HDL: 72 mg/dL (ref >39)
LDL Chol Calc (NIH): 183 mg/dL — ABNORMAL HIGH (ref 0–99)
Triglycerides: 97 mg/dL (ref 0–149)
VLDL Cholesterol Cal: 16 mg/dL (ref 5–40)

## 2020-01-31 LAB — HEMOGLOBIN A1C
Est. average glucose Bld gHb Est-mCnc: 105 mg/dL
Hgb A1c MFr Bld: 5.3 % (ref 4.8–5.6)

## 2020-01-31 LAB — CBC WITH DIFFERENTIAL/PLATELET
Basophils Absolute: 0.1 10*3/uL (ref 0.0–0.2)
Basos: 2 %
EOS (ABSOLUTE): 0.1 10*3/uL (ref 0.0–0.4)
Eos: 4 %
Hematocrit: 40.7 % (ref 34.0–46.6)
Hemoglobin: 13.9 g/dL (ref 11.1–15.9)
Immature Grans (Abs): 0 10*3/uL (ref 0.0–0.1)
Immature Granulocytes: 0 %
Lymphocytes Absolute: 1.4 10*3/uL (ref 0.7–3.1)
Lymphs: 41 %
MCH: 31.5 pg (ref 26.6–33.0)
MCHC: 34.2 g/dL (ref 31.5–35.7)
MCV: 92 fL (ref 79–97)
Monocytes Absolute: 0.3 10*3/uL (ref 0.1–0.9)
Monocytes: 9 %
Neutrophils Absolute: 1.6 10*3/uL (ref 1.4–7.0)
Neutrophils: 44 %
Platelets: 198 10*3/uL (ref 150–450)
RBC: 4.41 x10E6/uL (ref 3.77–5.28)
RDW: 11.8 % (ref 11.7–15.4)
WBC: 3.5 10*3/uL (ref 3.4–10.8)

## 2020-01-31 LAB — COMPREHENSIVE METABOLIC PANEL
ALT: 15 IU/L (ref 0–32)
AST: 23 IU/L (ref 0–40)
Albumin/Globulin Ratio: 1.8 (ref 1.2–2.2)
Albumin: 4.5 g/dL (ref 3.8–4.8)
Alkaline Phosphatase: 65 IU/L (ref 44–121)
BUN/Creatinine Ratio: 19 (ref 12–28)
BUN: 18 mg/dL (ref 8–27)
Bilirubin Total: 0.5 mg/dL (ref 0.0–1.2)
CO2: 24 mmol/L (ref 20–29)
Calcium: 9.5 mg/dL (ref 8.7–10.3)
Chloride: 102 mmol/L (ref 96–106)
Creatinine, Ser: 0.94 mg/dL (ref 0.57–1.00)
GFR calc Af Amer: 75 mL/min/{1.73_m2} (ref >59)
GFR calc non Af Amer: 65 mL/min/{1.73_m2} (ref >59)
Globulin, Total: 2.5 g/dL (ref 1.5–4.5)
Glucose: 83 mg/dL (ref 65–99)
Potassium: 4.3 mmol/L (ref 3.5–5.2)
Sodium: 139 mmol/L (ref 134–144)
Total Protein: 7 g/dL (ref 6.0–8.5)

## 2020-01-31 LAB — TSH: TSH: 1.44 u[IU]/mL (ref 0.450–4.500)

## 2020-02-12 ENCOUNTER — Encounter: Payer: Self-pay | Admitting: Physician Assistant

## 2020-02-12 DIAGNOSIS — Z1211 Encounter for screening for malignant neoplasm of colon: Secondary | ICD-10-CM

## 2020-02-26 LAB — COLOGUARD
COLOGUARD: NEGATIVE
Cologuard: NEGATIVE

## 2020-02-26 LAB — EXTERNAL GENERIC LAB PROCEDURE: COLOGUARD: NEGATIVE

## 2020-09-09 ENCOUNTER — Ambulatory Visit: Payer: Self-pay | Admitting: *Deleted

## 2020-09-09 NOTE — Telephone Encounter (Signed)
Unknown spider bite 2 days ago to the left anterior calf area. Pain with the bite. Blister formed over night with increased redness in the center 1 1/2 inches in diameter redness-no purple. Outer pink area approx 2 1/2 inches diameter. Blister still intact, slight warm to touch today with mild itch. Was not black widow/brown recluse she reported. Cleaned and applied antibiotic cream took benadryl. Continue care advice provided. No [facility] availability this week. Offered callback for [place] appointment availability-refused at this time. Having her mark areas and monitor for 24 hours. Any fever/purplish color/red streaks-call back or go to UC. Patient agreed.    Reason for Disposition  Bite starts to look bad (e.g., blister, purplish skin, ulcer) (Exception:  just swelling or small red bump)  Answer Assessment - Initial Assessment Questions 1. TYPE of SPIDER: "What type of spider was it?"  (e.g., name, unknown, or brief description)    Unknown-blood blister right afterwards 2. LOCATION: "Where is the bite located?"      Left anterior calf 3. PAIN: "Is there any pain?" If Yes, ask: "How bad is it?"  (Scale 1-10; or mild, moderate, severe)     Mild and itching 4. SWELLING: "How big is the swelling?" (Inches, cm or compare to coins)      Outer redness circle 2 1/2 inches in diameter, inner redness area 1 1/2 inches in diameter 5. ONSET: "When did the bite occur?" (Minutes or hours ago)      Saturday 7pm 6. TETANUS: "When was the last tetanus booster?"     Does not remember I will look up 7. OTHER SYMPTOMS: "Do you have any other symptoms?"  (e.g., muscle cramps, abdominal pain, change in urine color)     Feels warm to touch, no other sx  Protocols used: Spider Bite - [employer]

## 2020-12-16 ENCOUNTER — Telehealth: Payer: Self-pay

## 2020-12-16 ENCOUNTER — Encounter: Payer: Self-pay | Admitting: Family Medicine

## 2020-12-16 NOTE — Telephone Encounter (Signed)
Copied from [place] [id number]. Topic: Appointment Scheduling - Scheduling Inquiry for Clinic >> Dec 16, 2020 11:26 AM Vanessa Cohen A wrote: Reason for CRM: Patient would like to be seen by a female for their Physical   Agent was unable to successfully schedule at the time of call  Please contact further when available

## 2020-12-18 ENCOUNTER — Other Ambulatory Visit: Payer: Self-pay | Admitting: Family Medicine

## 2020-12-18 DIAGNOSIS — Z1231 Encounter for screening mammogram for malignant neoplasm of breast: Secondary | ICD-10-CM

## 2020-12-20 ENCOUNTER — Other Ambulatory Visit: Payer: Self-pay | Admitting: Family Medicine

## 2020-12-20 DIAGNOSIS — Z1231 Encounter for screening mammogram for malignant neoplasm of breast: Secondary | ICD-10-CM

## 2020-12-23 ENCOUNTER — Telehealth: Payer: Self-pay

## 2020-12-23 NOTE — Telephone Encounter (Signed)
Copied from [place] [phone]. Topic: Appointment Scheduling - Scheduling Inquiry for Clinic >> Dec 23, 2020 11:49 AM Yvette Rack wrote: Reason for CRM: Pt would like to reschedule CPE appt with Tally Joe. Pt requests call back. Cb# [phone]

## 2021-01-13 ENCOUNTER — Ambulatory Visit: Payer: BC Managed Care – PPO

## 2021-01-16 ENCOUNTER — Ambulatory Visit
Admission: RE | Admit: 2021-01-16 | Discharge: 2021-01-16 | Disposition: A | Discharge status: Home / Self Care | Payer: BC Managed Care – PPO | Source: Ambulatory Visit | Attending: Family Medicine | Admitting: Family Medicine

## 2021-01-16 ENCOUNTER — Other Ambulatory Visit: Payer: Self-pay

## 2021-01-16 DIAGNOSIS — Z1231 Encounter for screening mammogram for malignant neoplasm of breast: Secondary | ICD-10-CM | POA: Insufficient documentation

## 2021-01-17 ENCOUNTER — Other Ambulatory Visit: Payer: Self-pay | Admitting: Family Medicine

## 2021-01-17 DIAGNOSIS — N6320 Unspecified lump in the left breast, unspecified quadrant: Secondary | ICD-10-CM

## 2021-01-17 DIAGNOSIS — R928 Other abnormal and inconclusive findings on diagnostic imaging of breast: Secondary | ICD-10-CM

## 2021-01-20 ENCOUNTER — Other Ambulatory Visit: Payer: Self-pay

## 2021-01-20 DIAGNOSIS — N6320 Unspecified lump in the left breast, unspecified quadrant: Secondary | ICD-10-CM

## 2021-01-21 ENCOUNTER — Telehealth: Payer: Self-pay | Admitting: Family Medicine

## 2021-01-21 ENCOUNTER — Telehealth: Payer: Self-pay | Admitting: Physician Assistant

## 2021-01-21 NOTE — Telephone Encounter (Signed)
Patient called, left VM to return the call to the office. No CRM for PEC to disclose results of Mammogram, routing to the office.

## 2021-01-21 NOTE — Telephone Encounter (Signed)
Pt returned call to the office, anxious to discuss results. Please advise

## 2021-01-21 NOTE — Telephone Encounter (Signed)
Patient called back about lab results. Please call back. She is at work until 56

## 2021-01-22 ENCOUNTER — Other Ambulatory Visit: Payer: Self-pay | Admitting: Physician Assistant

## 2021-01-22 DIAGNOSIS — R922 Inconclusive mammogram: Secondary | ICD-10-CM

## 2021-01-22 NOTE — Progress Notes (Signed)
Last mammo was sus for a mass in the left breast--on mammo she has extremely dense breast tissue.   Signed order for left ultrasound to eval mass, but it is appropriate to do a right breast ultrasound as well as screening for her extremely dense breast tissue

## 2021-01-24 NOTE — Telephone Encounter (Signed)
Reviewed over report with patient. KW

## 2021-01-30 ENCOUNTER — Other Ambulatory Visit: Payer: Self-pay

## 2021-01-30 ENCOUNTER — Ambulatory Visit
Admission: RE | Admit: 2021-01-30 | Discharge: 2021-01-30 | Disposition: A | Discharge status: Home / Self Care | Payer: BC Managed Care – PPO | Source: Ambulatory Visit | Attending: Family Medicine | Admitting: Family Medicine

## 2021-01-30 DIAGNOSIS — R928 Other abnormal and inconclusive findings on diagnostic imaging of breast: Secondary | ICD-10-CM

## 2021-01-30 DIAGNOSIS — N6320 Unspecified lump in the left breast, unspecified quadrant: Secondary | ICD-10-CM | POA: Insufficient documentation

## 2021-01-31 ENCOUNTER — Other Ambulatory Visit: Payer: Self-pay | Admitting: Family Medicine

## 2021-01-31 DIAGNOSIS — N6320 Unspecified lump in the left breast, unspecified quadrant: Secondary | ICD-10-CM

## 2021-01-31 DIAGNOSIS — R928 Other abnormal and inconclusive findings on diagnostic imaging of breast: Secondary | ICD-10-CM

## 2021-02-26 ENCOUNTER — Encounter: Payer: BC Managed Care – PPO | Admitting: Family Medicine

## 2021-02-27 ENCOUNTER — Ambulatory Visit
Admission: RE | Admit: 2021-02-27 | Discharge: 2021-02-27 | Disposition: A | Discharge status: Home / Self Care | Payer: BC Managed Care – PPO | Source: Ambulatory Visit | Attending: Family Medicine | Admitting: Family Medicine

## 2021-02-27 ENCOUNTER — Other Ambulatory Visit: Payer: Self-pay

## 2021-02-27 DIAGNOSIS — R928 Other abnormal and inconclusive findings on diagnostic imaging of breast: Secondary | ICD-10-CM | POA: Insufficient documentation

## 2021-02-27 DIAGNOSIS — N6320 Unspecified lump in the left breast, unspecified quadrant: Secondary | ICD-10-CM | POA: Insufficient documentation

## 2021-02-27 HISTORY — PX: BREAST BIOPSY: SHX20

## 2021-02-28 LAB — SURGICAL PATHOLOGY

## 2021-04-04 ENCOUNTER — Encounter: Payer: Self-pay | Admitting: Family Medicine

## 2021-04-04 ENCOUNTER — Ambulatory Visit (INDEPENDENT_AMBULATORY_CARE_PROVIDER_SITE_OTHER): Payer: BC Managed Care – PPO | Admitting: Family Medicine

## 2021-04-04 ENCOUNTER — Other Ambulatory Visit (HOSPITAL_COMMUNITY)
Admission: RE | Admit: 2021-04-04 | Discharge: 2021-04-04 | Disposition: A | Discharge status: Home / Self Care | Payer: BC Managed Care – PPO | Source: Ambulatory Visit | Attending: Family Medicine | Admitting: Family Medicine

## 2021-04-04 ENCOUNTER — Other Ambulatory Visit: Payer: Self-pay

## 2021-04-04 VITALS — BP 105/72 | HR 69 | Temp 98.0°F | Resp 14 | Ht 67.0 in | Wt 147.5 lb

## 2021-04-04 DIAGNOSIS — I5022 Chronic systolic (congestive) heart failure: Secondary | ICD-10-CM | POA: Diagnosis not present

## 2021-04-04 DIAGNOSIS — Z0000 Encounter for general adult medical examination without abnormal findings: Secondary | ICD-10-CM

## 2021-04-04 DIAGNOSIS — Z950 Presence of cardiac pacemaker: Secondary | ICD-10-CM | POA: Diagnosis not present

## 2021-04-04 DIAGNOSIS — Z124 Encounter for screening for malignant neoplasm of cervix: Secondary | ICD-10-CM | POA: Diagnosis present

## 2021-04-04 DIAGNOSIS — E7800 Pure hypercholesterolemia, unspecified: Secondary | ICD-10-CM

## 2021-04-04 DIAGNOSIS — J301 Allergic rhinitis due to pollen: Secondary | ICD-10-CM

## 2021-04-04 MED ORDER — FLUTICASONE PROPIONATE 50 MCG/ACT NA SUSP: NASAL | 5 refills | Status: DC

## 2021-04-04 MED ORDER — CETIRIZINE HCL 10 MG PO TABS: 10.0000 mg | ORAL_TABLET | Freq: Every day | ORAL | 3 refills | Status: AC

## 2021-04-04 NOTE — Assessment & Plan Note (Signed)
Denies symptoms PAP completed External mole noted on exam Not previously noted Pt will plan to discuss with her derm

## 2021-04-04 NOTE — Assessment & Plan Note (Signed)
Continue aggressive management Goal of LDL <70 Repeat lipid panel today

## 2021-04-04 NOTE — Assessment & Plan Note (Signed)
UTD on dental Due for vision Things to do to keep yourself healthy  - Exercise at least 30-45 minutes a day, 3-4 days a week.  - Eat a low-fat diet with lots of fruits and vegetables, up to 7-9 servings per day.  - Seatbelts can save your life. Wear them always.  - Smoke detectors on every level of your home, check batteries every year.  - Eye Doctor - have an eye exam every 1-2 years  - Safe sex - if you may be exposed to STDs, use a condom.  - Alcohol -  If you drink, do it moderately, less than 2 drinks per day.  - [place]. Choose someone to speak for you if you are not able.  - Depression is common in our stressful world.If you're feeling down or losing interest in things you normally enjoy, please come in for a visit.  - Violence - If anyone is threatening or hurting you, please call immediately.

## 2021-04-04 NOTE — Assessment & Plan Note (Signed)
Request PO and nasal steroid No symptoms today

## 2021-04-04 NOTE — Assessment & Plan Note (Signed)
Chronic, stable Continue routine f/u with cards

## 2021-04-04 NOTE — Progress Notes (Signed)
Complete physical exam   Patient: Vanessa Cohen   DOB: 02-28-1956   66 y.o. Female  MRN: [id number] Visit Date: 04/04/2021  Today's healthcare provider: [name], FNP   Chief Complaint  Patient presents with   Annual Exam   Subjective     HPI  Vanessa Cohen is a 66 y.o. female who presents today for a complete physical exam.  She reports consuming a general diet. Home exercise routine includes walking up to 4-5x a week. She generally feels fairly well. She reports sleeping well. She does have additional problems to discuss today. Patient states that for the past two months or more she has been feeling lightheaded when there is sudden position change and states that she has concerns that it is because of lack of fluids, patient also complains of pain in her toes on her right foot that has been present for 4 months or more. Patient describes toe pain as a achy feeling and denies any symptoms of numbness/tingling.   Past Medical History:  Diagnosis Date   Automatic implantable cardiac defibrillator in situ    Basal cell carcinoma    Cancer (HCC)    melanoma   Congestive heart failure, unspecified    Secondary cardiomyopathy, unspecified    Past Surgical History:  Procedure Laterality Date   BREAST BIOPSY Left    core bx- neg   BREAST BIOPSY Left 02/27/2021   site 1 1:00 venus, site 2 2:00 ribbon   Social History   Socioeconomic History   Marital status: Single    Spouse name: Not on file   Number of children: Not on file   Years of education: Not on file   Highest education level: Not on file  Occupational History   Not on file  Tobacco Use   Smoking status: Never   Smokeless tobacco: Never  Vaping Use   Vaping Use: Never used  Substance and Sexual Activity   Alcohol use: Yes    Comment: occasional glass of wine   Drug use: No   Sexual activity: Not on file  Other Topics Concern   Not on file  Social History Narrative   Full time. Does not regularly  exercise.    Social Determinants of Health   Financial Resource Strain: Not on file  Food Insecurity: Not on file  Transportation Needs: Not on file  Physical Activity: Not on file  Stress: Not on file  Social Connections: Not on file  Intimate Partner Violence: Not on file   Family Status  Relation Name Status   Other  (Not Specified)   Mother  Deceased   Father  Deceased   Sister  Alive   Brother  Alive   Sister  Alive   Brother  Alive   Neg Hx  (Not Specified)   Family History  Problem Relation Age of Onset   Stroke Other    Heart disease Mother    Cancer Father        lung cancer   Breast cancer Neg Hx    Allergies  Allergen Reactions   Sulfa Antibiotics     Patient Care Team: [name], FNP as PCP - General (Family Medicine)   Medications: Outpatient Medications Prior to Visit  Medication Sig   azelastine (OPTIVAR) 0.05 % ophthalmic solution Place 1 drop into both eyes 2 (two) times daily.   carvedilol (COREG) 12.5 MG tablet Take 12.5 mg by mouth 2 (two) times daily.   furosemide (LASIX)  20 MG tablet    Multiple Vitamin (MULTIVITAMIN ADULT PO) Take 1 tablet by mouth daily.   ramipril (ALTACE) 10 MG capsule Take 10 mg by mouth daily.     [DISCONTINUED] cetirizine (ZYRTEC) 10 MG tablet Take 10 mg by mouth daily.     [DISCONTINUED] fluticasone (FLONASE) 50 MCG/ACT nasal spray SPRAY 2 SPRAY IN EACH NOSRTIL DAILY   [DISCONTINUED] carvedilol (COREG) 25 MG tablet Take 25 mg by mouth 2 (two) times daily.     No facility-administered medications prior to visit.    Review of Systems  Allergic/Immunologic: Positive for environmental allergies.  Neurological:  Positive for light-headedness.  All other systems reviewed and are negative.    Objective    BP 105/72    Pulse 69    Temp 98 F (36.7 C) (Oral)    Resp 14    Ht 5' 7"  (1.702 m)    Wt 147 lb 8 oz (66.9 kg)    BMI 23.10 kg/m    Physical Exam Vitals and nursing note reviewed.  Constitutional:       General: She is awake. She is not in acute distress.    Appearance: Normal appearance. She is well-developed, well-groomed and normal weight. She is not ill-appearing, toxic-appearing or diaphoretic.  HENT:     Head: Normocephalic and atraumatic.     Jaw: There is normal jaw occlusion. No trismus, tenderness, swelling or pain on movement.     Right Ear: Hearing, tympanic membrane, ear canal and external ear normal. There is no impacted cerumen.     Left Ear: Hearing, tympanic membrane, ear canal and external ear normal. There is no impacted cerumen.     Nose: Nose normal. No congestion or rhinorrhea.     Right Turbinates: Not enlarged, swollen or pale.     Left Turbinates: Not enlarged, swollen or pale.     Right Sinus: No maxillary sinus tenderness or frontal sinus tenderness.     Left Sinus: No maxillary sinus tenderness or frontal sinus tenderness.     Mouth/Throat:     Lips: Pink.     Mouth: Mucous membranes are moist. No injury.     Tongue: No lesions.     Pharynx: Oropharynx is clear. Uvula midline. No pharyngeal swelling, oropharyngeal exudate, posterior oropharyngeal erythema or uvula swelling.     Tonsils: No tonsillar exudate or tonsillar abscesses.  Eyes:     General: Lids are normal. Lids are everted, no foreign bodies appreciated. Vision grossly intact. Gaze aligned appropriately. No allergic shiner or visual field deficit.       Right eye: No discharge.        Left eye: No discharge.     Extraocular Movements: Extraocular movements intact.     Conjunctiva/sclera: Conjunctivae normal.     Right eye: Right conjunctiva is not injected. No exudate.    Left eye: Left conjunctiva is not injected. No exudate.    Pupils: Pupils are equal, round, and reactive to light.  Neck:     Thyroid: No thyroid mass, thyromegaly or thyroid tenderness.     Vascular: No carotid bruit.     Trachea: Trachea normal.  Cardiovascular:     Rate and Rhythm: Normal rate and regular rhythm.      Pulses: Normal pulses.          Carotid pulses are 2+ on the right side and 2+ on the left side.      Radial pulses are 2+ on the right side and 2+  on the left side.       Dorsalis pedis pulses are 2+ on the right side and 2+ on the left side.       Posterior tibial pulses are 2+ on the right side and 2+ on the left side.     Heart sounds: Normal heart sounds, S1 normal and S2 normal. No murmur heard.   No friction rub. No gallop.  Pulmonary:     Effort: Pulmonary effort is normal. No respiratory distress.     Breath sounds: Normal breath sounds and air entry. No stridor. No wheezing, rhonchi or rales.  Chest:     Chest wall: No tenderness.     Comments: Breast exam deferred; discussed 'know your lemons' campaign and self exam Abdominal:     General: Abdomen is flat. Bowel sounds are normal. There is no distension.     Palpations: Abdomen is soft. There is no mass.     Tenderness: There is no abdominal tenderness. There is no right CVA tenderness, left CVA tenderness, guarding or rebound.     Hernia: No hernia is present.  Genitourinary:      Comments: Exam deferred; denies complaints Musculoskeletal:        General: No swelling, tenderness, deformity or signs of injury. Normal range of motion.     Cervical back: Full passive range of motion without pain, normal range of motion and neck supple. No edema, rigidity or tenderness. No muscular tenderness.     Right lower leg: No edema.     Left lower leg: No edema.  Lymphadenopathy:     Cervical: No cervical adenopathy.     Right cervical: No superficial, deep or posterior cervical adenopathy.    Left cervical: No superficial, deep or posterior cervical adenopathy.  Skin:    General: Skin is warm and dry.     Capillary Refill: Capillary refill takes less than 2 seconds.     Coloration: Skin is not jaundiced or pale.     Findings: No bruising, erythema, lesion or rash.  Neurological:     General: No focal deficit present.     Mental  Status: She is alert and oriented to person, place, and time. Mental status is at baseline.     GCS: GCS eye subscore is 4. GCS verbal subscore is 5. GCS motor subscore is 6.     Sensory: Sensation is intact. No sensory deficit.     Motor: Motor function is intact. No weakness.     Coordination: Coordination is intact. Coordination normal.     Gait: Gait is intact. Gait normal.  Psychiatric:        Attention and Perception: Attention and perception normal.        Mood and Affect: Mood and affect normal.        Speech: Speech normal.        Behavior: Behavior normal. Behavior is cooperative.        Thought Content: Thought content normal.        Cognition and Memory: Cognition and memory normal.        Judgment: Judgment normal.     Last depression screening scores PHQ 2/9 Scores 04/04/2021 01/24/2020 12/02/2018  PHQ - 2 Score 0 0 0  PHQ- 9 Score - - -   Last fall risk screening Fall Risk  04/04/2021  Falls in the past year? 0  Number falls in past yr: 0  Injury with Fall? 0   Last Audit-C alcohol use screening Alcohol Use  Disorder Test (AUDIT) 04/04/2021  1. How often do you have a drink containing alcohol? 2  2. How many drinks containing alcohol do you have on a typical day when you are drinking? 0  3. How often do you have six or more drinks on one occasion? 0  AUDIT-C Score 2  Alcohol Brief Interventions/Follow-up -   A score of 3 or more in women, and 4 or more in men indicates increased risk for alcohol abuse, EXCEPT if all of the points are from question 1   No results found for any visits on 04/04/21.  Assessment & Plan    Routine Health Maintenance and Physical Exam  Exercise Activities and Dietary recommendations  Goals   None     Immunization History  Administered Date(s) Administered   Hepatitis B 05/30/1992, 07/04/1992, 01/02/1993   Influenza Split 01/03/2013   Influenza,inj,Quad PF,6+ Mos 12/02/2018   Influenza-Unspecified 12/02/2018   MMR 04/18/2010    PPD Test 02/22/2007, 01/29/2014   Pneumococcal Polysaccharide-23 04/08/2013   Td 03/27/2005   Tdap 05/05/2010    Health Maintenance  Topic Date Due   COVID-19 Vaccine (1) Never done   Zoster Vaccines- Shingrix (1 of 2) Never done   COLONOSCOPY (Pts 45-50yr Insurance coverage will need to be confirmed)  12/06/2019   TETANUS/TDAP  05/05/2020   INFLUENZA VACCINE  10/14/2020   PAP SMEAR-Modifier  11/24/2020   MAMMOGRAM  01/16/2022   Hepatitis C Screening  Completed   HIV Screening  Completed   HPV VACCINES  Aged Out    Discussed health benefits of physical activity, and encouraged her to engage in regular exercise appropriate for her age and condition.    Return in about 1 year (around 04/04/2022) for annual examination.     Problem List Items Addressed This Visit       Cardiovascular and Mediastinum   Chronic systolic CHF (congestive heart failure) (HCC)    Chronic, stable Continue routine f/u with cards      Relevant Medications   carvedilol (COREG) 12.5 MG tablet   Other Relevant Orders   Comprehensive metabolic panel     Respiratory   Allergic rhinitis    Request PO and nasal steroid No symptoms today      Relevant Medications   fluticasone (FLONASE) 50 MCG/ACT nasal spray     Other   Hypercholesteremia    Continue aggressive management Goal of LDL <70 Repeat lipid panel today      Relevant Medications   carvedilol (COREG) 12.5 MG tablet   Other Relevant Orders   Lipid panel   Cardiac pacemaker in situ    No complications Continue recommended f/u for battery replacement      Annual physical exam - Primary    UTD on dental Due for vision Things to do to keep yourself healthy  - Exercise at least 30-45 minutes a day, 3-4 days a week.  - Eat a low-fat diet with lots of fruits and vegetables, up to 7-9 servings per day.  - Seatbelts can save your life. Wear them always.  - Smoke detectors on every level of your home, check batteries every year.  -  Eye Doctor - have an eye exam every 1-2 years  - Safe sex - if you may be exposed to STDs, use a condom.  - Alcohol -  If you drink, do it moderately, less than 2 drinks per day.  - [facility] Choose someone to speak for you if you are  not able.  - Depression is common in our stressful world.If you're feeling down or losing interest in things you normally enjoy, please come in for a visit.  - Violence - If anyone is threatening or hurting you, please call immediately.        Cervical cancer screening    Denies symptoms PAP completed External mole noted on exam Not previously noted Pt will plan to discuss with her derm      Relevant Orders   Cytology - PAP    Patient seen and examined by Tally Joe,  FNP note scribed by Jennings Books, [place], [name], FNP, have reviewed all documentation for this visit. The documentation on 04/04/21 for the exam, diagnosis, procedures, and orders are all accurate and complete.  [name], [place] [phone] (phone) [phone] (fax)  [place]

## 2021-04-04 NOTE — Assessment & Plan Note (Signed)
No complications Continue recommended f/u for battery replacement

## 2021-04-05 LAB — COMPREHENSIVE METABOLIC PANEL
ALT: 20 IU/L (ref 0–32)
AST: 21 IU/L (ref 0–40)
Albumin/Globulin Ratio: 1.8 (ref 1.2–2.2)
Albumin: 4.5 g/dL (ref 3.8–4.8)
Alkaline Phosphatase: 79 IU/L (ref 44–121)
BUN/Creatinine Ratio: 16 (ref 12–28)
BUN: 15 mg/dL (ref 8–27)
Bilirubin Total: 0.3 mg/dL (ref 0.0–1.2)
CO2: 25 mmol/L (ref 20–29)
Calcium: 8.9 mg/dL (ref 8.7–10.3)
Chloride: 105 mmol/L (ref 96–106)
Creatinine, Ser: 0.93 mg/dL (ref 0.57–1.00)
Globulin, Total: 2.5 g/dL (ref 1.5–4.5)
Glucose: 77 mg/dL (ref 70–99)
Potassium: 4.2 mmol/L (ref 3.5–5.2)
Sodium: 143 mmol/L (ref 134–144)
Total Protein: 7 g/dL (ref 6.0–8.5)
eGFR: 69 mL/min/{1.73_m2} (ref >59)

## 2021-04-05 LAB — LIPID PANEL
Chol/HDL Ratio: 3.2 ratio (ref 0.0–4.4)
Cholesterol, Total: 230 mg/dL — ABNORMAL HIGH (ref 100–199)
HDL: 71 mg/dL (ref >39)
LDL Chol Calc (NIH): 140 mg/dL — ABNORMAL HIGH (ref 0–99)
Triglycerides: 109 mg/dL (ref 0–149)
VLDL Cholesterol Cal: 19 mg/dL (ref 5–40)

## 2021-04-08 LAB — CYTOLOGY - PAP: Diagnosis: NEGATIVE

## 2021-11-20 IMAGING — MG MM DIGITAL SCREENING BILAT W/ TOMO AND CAD
6 of 12 series · 6 of 36 positions shown · non-contrast
Comparison: Previous exam(s).

CLINICAL DATA: Screening.

EXAM:
DIGITAL SCREENING BILATERAL MAMMOGRAM WITH TOMOSYNTHESIS AND CAD
TECHNIQUE: Bilateral screening digital craniocaudal and mediolateral oblique
mammograms were obtained. Bilateral screening digital breast
tomosynthesis was performed. The images were evaluated with
computer-aided detection.

[R MLO synth-2D]
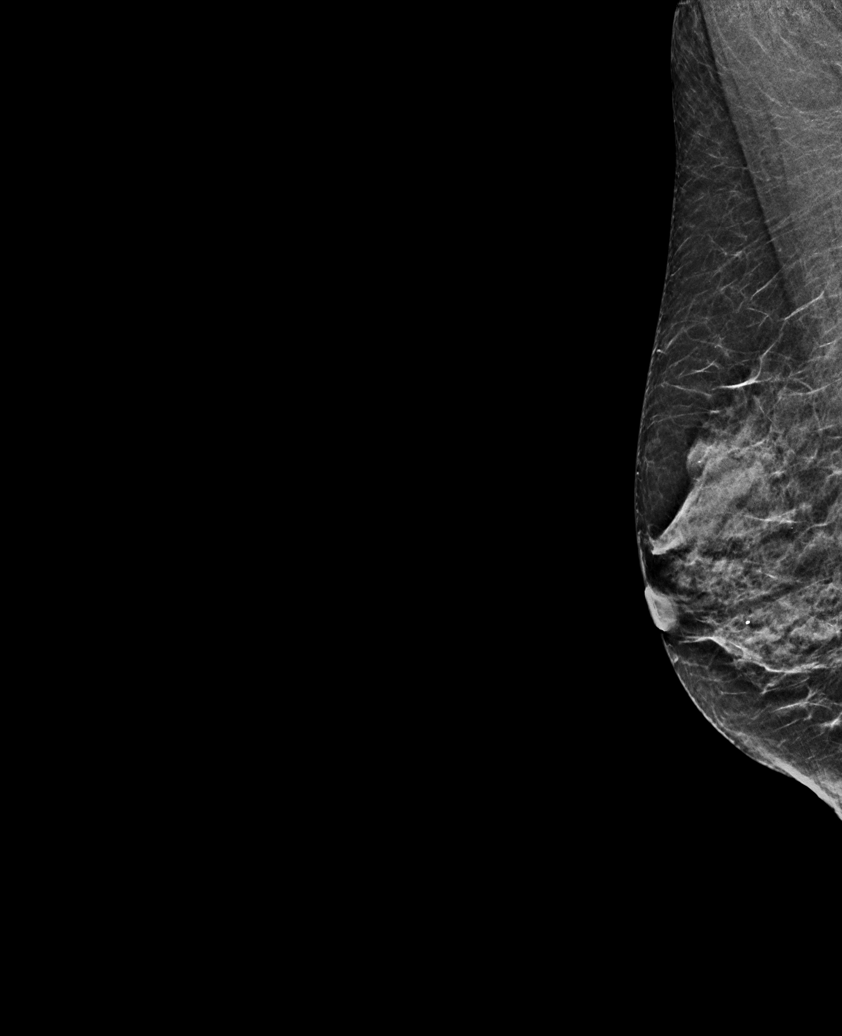

[L MLO synth-2D (1 of 2)]
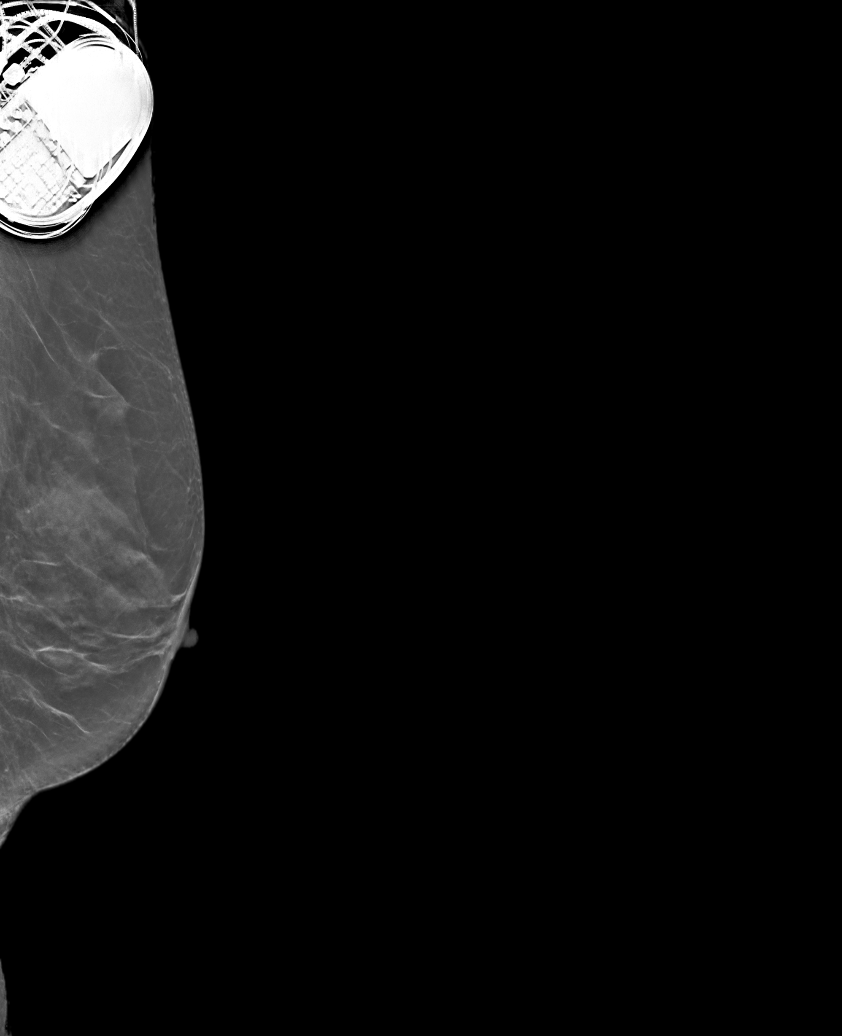

[R CC synth-2D (1 of 2)]
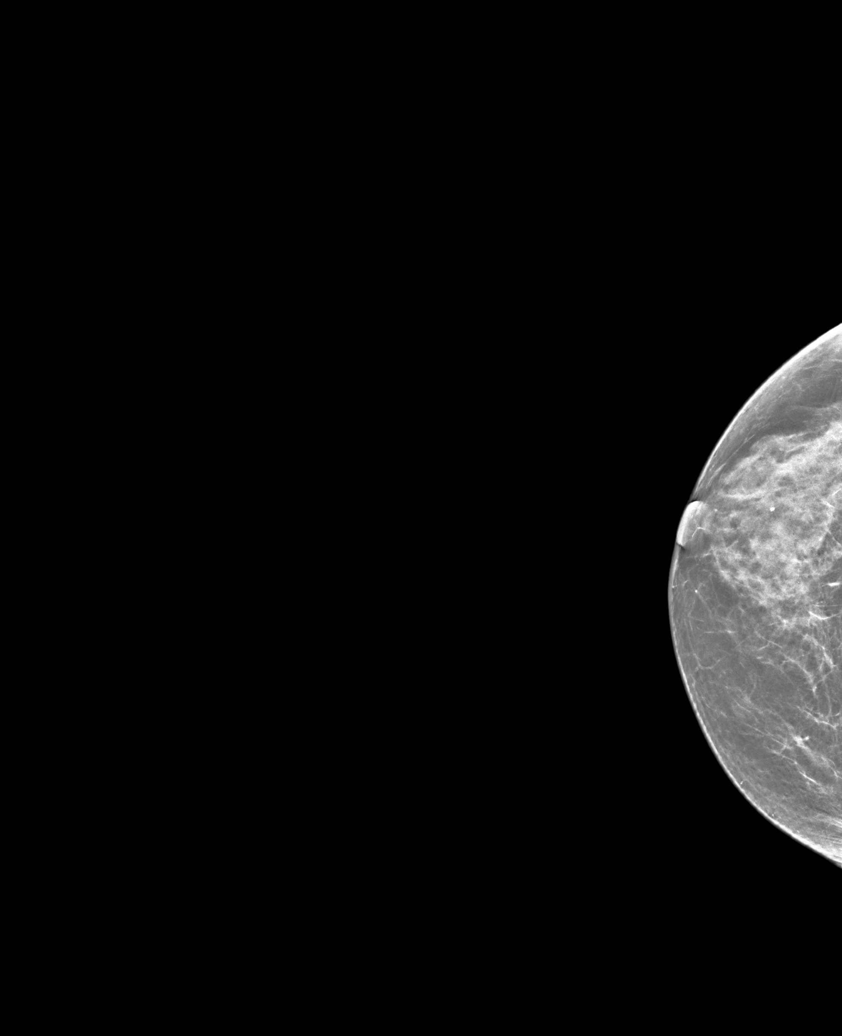

[L CC synth-2D]
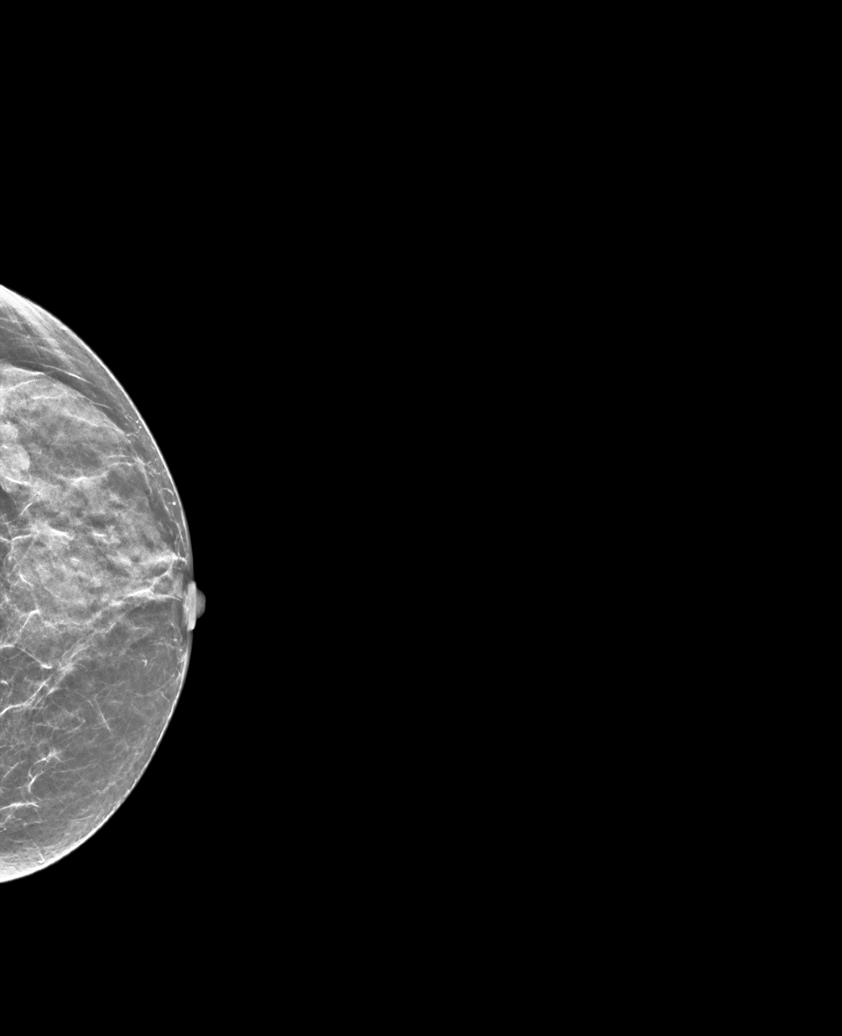

[R CC synth-2D (2 of 2)]
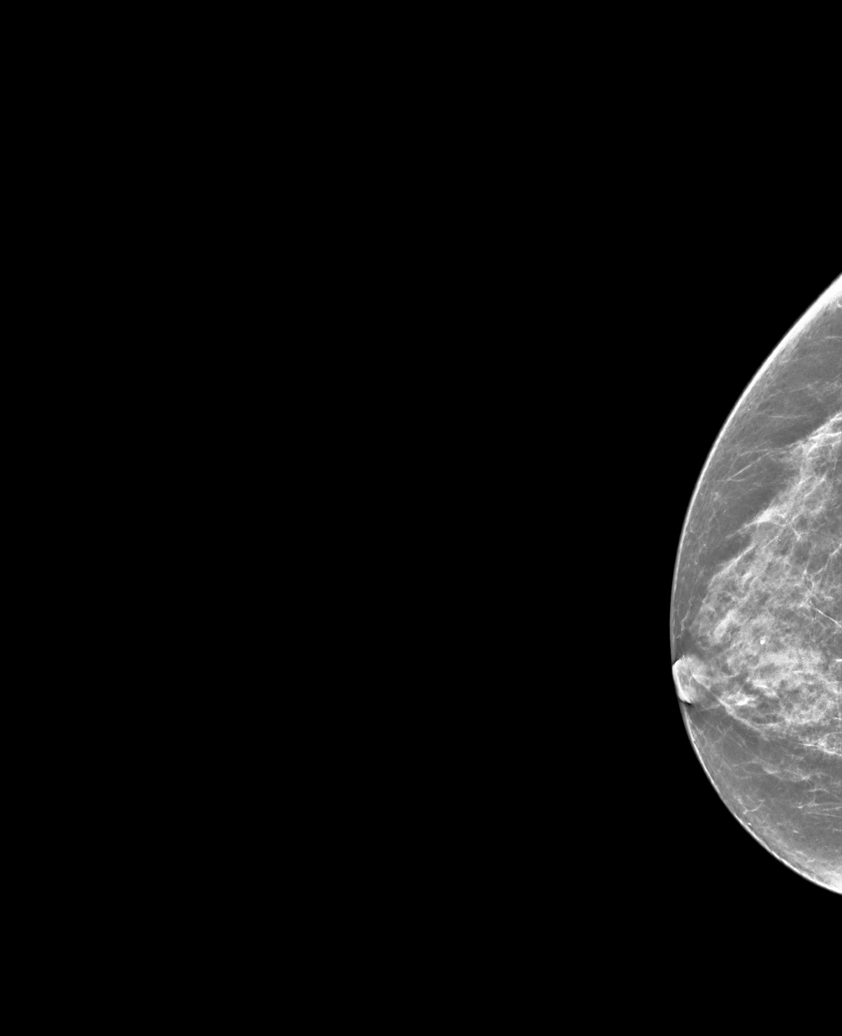

[L MLO synth-2D (2 of 2)]
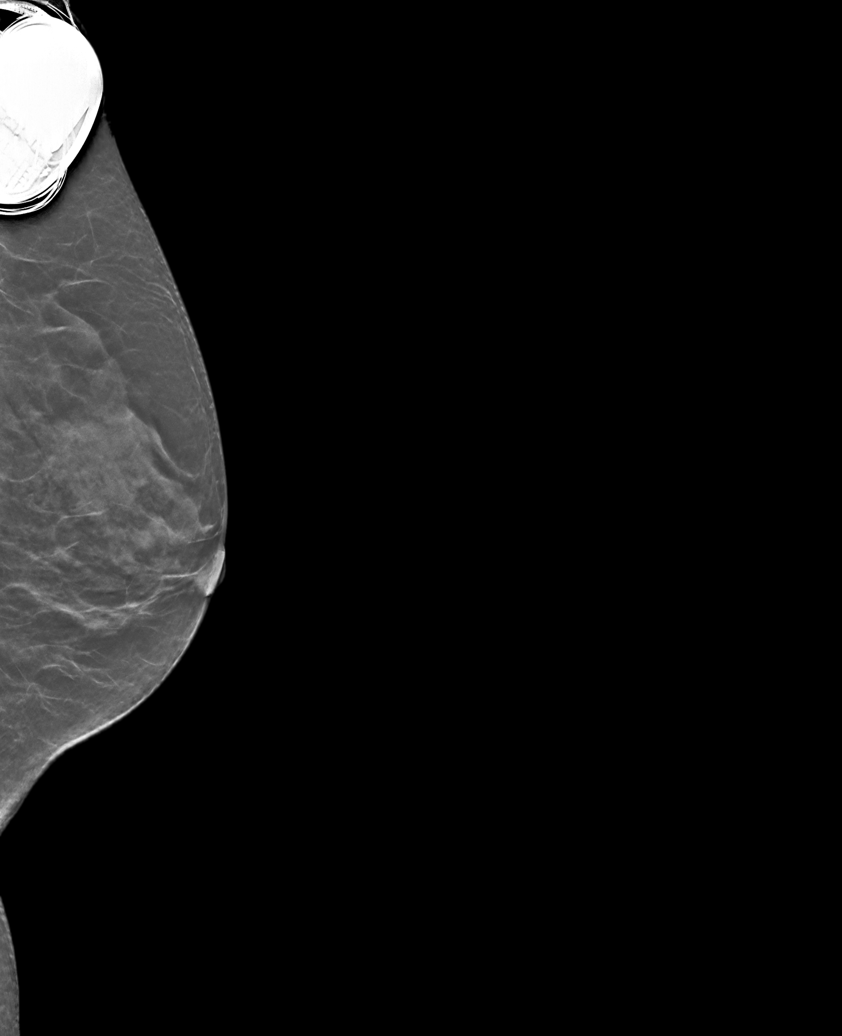

[6 of 36 positions shown; findings below may reference images not displayed]

ACR Breast Density Category d: The breast tissue is extremely dense,
which lowers the sensitivity of mammography.
FINDINGS: In the left breast, a possible mass warrants further evaluation. In
the right breast, no findings suspicious for malignancy.
IMPRESSION: Further evaluation is suggested for a possible mass in the left
breast.

RECOMMENDATION:
Diagnostic mammogram and possibly ultrasound of the left breast.
(Code:[id number]-[id number])

The patient will be contacted regarding the findings, and additional
imaging will be scheduled.

BI-RADS CATEGORY  0: Incomplete. Need additional imaging evaluation
and/or prior mammograms for comparison.

## 2021-12-24 ENCOUNTER — Other Ambulatory Visit: Payer: Self-pay | Admitting: Family Medicine

## 2021-12-24 DIAGNOSIS — Z1231 Encounter for screening mammogram for malignant neoplasm of breast: Secondary | ICD-10-CM

## 2022-01-22 ENCOUNTER — Ambulatory Visit
Admission: RE | Admit: 2022-01-22 | Discharge: 2022-01-22 | Disposition: A | Discharge status: Home / Self Care | Payer: Medicare PPO | Source: Ambulatory Visit | Attending: Family Medicine | Admitting: Family Medicine

## 2022-01-22 DIAGNOSIS — Z1231 Encounter for screening mammogram for malignant neoplasm of breast: Secondary | ICD-10-CM | POA: Diagnosis present

## 2022-01-23 NOTE — Progress Notes (Signed)
[facility]  Normal mammogram; repeat in 1 year.  Please let us know if you have any questions.  Thank you,  Tally Joe, FNP

## 2022-04-15 NOTE — Progress Notes (Unsigned)
I,Vanessa Cohen,acting as a Education administrator for Vanessa Sprout, FNP.,have documented all relevant documentation on the behalf of Vanessa Sprout, FNP,as directed by  Vanessa Sprout, FNP while in the presence of Vanessa Sprout, FNP.  Medicare Initial Preventative Physical Exam  Patient: Vanessa Cohen, Female    DOB: 1955-11-23, 68 y.o.   MRN: [id number] Visit Date: 04/16/2022  Today's Provider: [name], FNP  Introduced to nurse practitioner role and practice setting.  All questions answered.  Discussed provider/patient relationship and expectations.  Chief Complaint  Patient presents with   Annual Exam   Subjective    Medicare Initial Preventative Physical Exam Vanessa Cohen is a 67 y.o. female who presents today for her Initial Preventative Physical Exam.   HPI  Patient declined all due immunizations.  Social History   Socioeconomic History   Marital status: Single    Spouse name: Not on file   Number of children: Not on file   Years of education: Not on file   Highest education level: Not on file  Occupational History   Not on file  Tobacco Use   Smoking status: Never   Smokeless tobacco: Never  Vaping Use   Vaping Use: Never used  Substance and Sexual Activity   Alcohol use: Yes    Comment: occasional glass of wine   Drug use: No   Sexual activity: Not on file  Other Topics Concern   Not on file  Social History Narrative   Full time. Does not regularly exercise.    Social Determinants of Health   Financial Resource Strain: Not on file  Food Insecurity: Not on file  Transportation Needs: Not on file  Physical Activity: Not on file  Stress: Not on file  Social Connections: Not on file  Intimate Partner Violence: Not on file    Past Medical History:  Diagnosis Date   Automatic implantable cardiac defibrillator in situ    Basal cell carcinoma    Cancer ([place])    melanoma   Congestive heart failure, unspecified    Secondary cardiomyopathy, unspecified       Patient Active Problem List   Diagnosis Date Noted   Welcome to Medicare preventive visit 04/16/2022   Annual physical exam 04/04/2021   Cervical cancer screening 04/04/2021   Allergic reaction 09/21/2019   Primary osteoarthritis of both knees 11/25/2015   Hypercholesteremia 11/25/2015   Cardiac pacemaker in situ 01/26/2013   Hypertension 36/64/4034   Chronic systolic CHF (congestive heart failure) ([place]) 08/26/2012   Allergic rhinitis 05/24/2009   CARDIOMYOPATHY, SECONDARY 01/02/2009   H/O malignant neoplasm of skin 03/16/2006   Osteopenia 04/11/2003   Disorder of skeletal muscle 03/16/1998    Past Surgical History:  Procedure Laterality Date   BREAST BIOPSY Left    core bx- neg   BREAST BIOPSY Left 02/27/2021   site 1 1:00 venus, site 2 2:00 ribbon    Her family history includes Cancer in her father; Heart disease in her mother; Stroke in an other family member. There is no history of Breast cancer.   Current Outpatient Medications:    azelastine (OPTIVAR) 0.05 % ophthalmic solution, Place 1 drop into both eyes 2 (two) times daily., Disp: 6 mL, Rfl: 12   carvedilol (COREG) 12.5 MG tablet, Take 6.25 mg by mouth 2 (two) times daily., Disp: , Rfl:    cetirizine (ZYRTEC) 10 MG tablet, Take 1 tablet (10 mg total) by mouth daily., Disp: 90 tablet, Rfl: 3   ENTRESTO 24-26  MG, Take 1 tablet by mouth 2 (two) times daily., Disp: , Rfl:    fluticasone (FLONASE) 50 MCG/ACT nasal spray, SPRAY 2 SPRAY IN EACH NOSRTIL DAILY, Disp: 16 g, Rfl: 5   furosemide (LASIX) 20 MG tablet, , Disp: , Rfl:    Multiple Vitamin (MULTIVITAMIN ADULT PO), Take 1 tablet by mouth daily., Disp: , Rfl:    ramipril (ALTACE) 10 MG capsule, Take 10 mg by mouth daily.   (Patient not taking: Reported on 04/16/2022), Disp: , Rfl:    Patient Care Team: Vanessa Sprout, FNP as PCP - General (Family Medicine)  Review of Systems  Last CBC Lab Results  Component Value Date   WBC 3.5 01/30/2020   HGB 13.9 01/30/2020    HCT 40.7 01/30/2020   MCV 92 01/30/2020   MCH 31.5 01/30/2020   RDW 11.8 01/30/2020   PLT 198 29/79/8921   Last metabolic panel Lab Results  Component Value Date   GLUCOSE 77 04/04/2021   NA 143 04/04/2021   K 4.2 04/04/2021   CL 105 04/04/2021   CO2 25 04/04/2021   BUN 15 04/04/2021   CREATININE 0.93 04/04/2021   EGFR 69 04/04/2021   CALCIUM 8.9 04/04/2021   PROT 7.0 04/04/2021   ALBUMIN 4.5 04/04/2021   LABGLOB 2.5 04/04/2021   AGRATIO 1.8 04/04/2021   BILITOT 0.3 04/04/2021   ALKPHOS 79 04/04/2021   AST 21 04/04/2021   ALT 20 04/04/2021   Last lipids Lab Results  Component Value Date   CHOL 230 (H) 04/04/2021   HDL 71 04/04/2021   LDLCALC 140 (H) 04/04/2021   TRIG 109 04/04/2021   CHOLHDL 3.2 04/04/2021   Last hemoglobin A1c Lab Results  Component Value Date   HGBA1C 5.3 01/30/2020   Last thyroid functions Lab Results  Component Value Date   TSH 1.440 01/30/2020    Objective    Vitals: BP 131/82 (BP Location: Right Arm, Patient Position: Sitting, Cuff Size: Normal)   Pulse 72   Temp 98.1 F (36.7 C) (Oral)   Ht 5' 7.5" (1.715 m)   Wt 153 lb 4.8 oz (69.5 kg)   SpO2 99%   BMI 23.66 kg/m   Vision Screening   Right eye Left eye Both eyes  Without correction     With correction 20/15 20/15    Physical Exam Vitals and nursing note reviewed.  Constitutional:      General: She is not in acute distress.    Appearance: Normal appearance. She is normal weight. She is not ill-appearing, toxic-appearing or diaphoretic.  HENT:     Head: Normocephalic and atraumatic.  Cardiovascular:     Rate and Rhythm: Normal rate.     Pulses: Normal pulses.  Pulmonary:     Effort: Pulmonary effort is normal.  Skin:    General: Skin is warm and dry.  Neurological:     General: No focal deficit present.     Mental Status: She is alert and oriented to person, place, and time. Mental status is at baseline.  Psychiatric:        Mood and Affect: Mood normal.         Behavior: Behavior normal.        Thought Content: Thought content normal.        Judgment: Judgment normal.    Activities of Daily Living    04/16/2022    9:17 AM  In your present state of health, do you have any difficulty performing the following activities:  Hearing? 0  Vision? 0  Difficulty concentrating or making decisions? 0  Walking or climbing stairs? 0  Dressing or bathing? 0  Doing errands, shopping? 0    Fall Risk Assessment    04/16/2022    9:16 AM 04/04/2021    8:48 AM 12/02/2018    9:21 AM 01/15/2017   10:38 AM  Fall Risk   Falls in the past year? 1 0 0 No  Number falls in past yr: 0 0 0   Injury with Fall? 0 0 0      Depression Screen    04/16/2022    9:17 AM 04/04/2021    8:48 AM 01/24/2020    1:15 PM 12/02/2018    9:18 AM  PHQ 2/9 Scores  PHQ - 2 Score 0 0 0 0  PHQ- 9 Score 2          04/16/2022    9:14 AM  6CIT Screen  What Year? 0 points  What month? 0 points  What time? 0 points  Count back from 20 0 points  Months in reverse 0 points  Repeat phrase 6 points  Total Score 6 points    No results found for any visits on 04/16/22.  Assessment & Plan      Initial Preventative Physical Exam  Reviewed patient's Family Medical History Reviewed and updated list of patient's medical providers Assessment of cognitive impairment was done Assessed patient's functional ability Established a written schedule for health screening [place] Completed and Reviewed  Exercise Activities and Dietary recommendations  Goals   None     Immunization History  Administered Date(s) Administered   Hepatitis B 05/30/1992, 07/04/1992, 01/02/1993   Influenza Split 01/03/2013   Influenza,inj,Quad PF,6+ Mos 12/02/2018   Influenza-Unspecified 12/02/2018   MMR 04/18/2010   PPD Test 02/22/2007, 01/29/2014   Pneumococcal Polysaccharide-23 04/08/2013   Td 03/27/2005   Tdap 05/05/2010    Health Maintenance  Topic Date Due   COVID-19  Vaccine (1) Never done   Zoster Vaccines- Shingrix (1 of 2) Never done   DTaP/Tdap/Td (3 - Td or Tdap) 05/05/2020   Pneumonia Vaccine 59+ Years old (2 - PCV) 09/01/2021   DEXA SCAN  Never done   INFLUENZA VACCINE  06/14/2022 (Originally 10/14/2021)   MAMMOGRAM  01/23/2023   Fecal DNA (Cologuard)  02/19/2023   Medicare Annual Wellness (AWV)  04/17/2023   PAP SMEAR-Modifier  04/04/2026   Hepatitis C Screening  Completed   HIV Screening  Completed   HPV VACCINES  Aged Out   COLONOSCOPY (Pts 45-76yr Insurance coverage will need to be confirmed)  Discontinued   Discussed health benefits of physical activity, and encouraged her to engage in regular exercise appropriate for her age and condition.   Problem List Items Addressed This Visit       Cardiovascular and Mediastinum   Chronic systolic CHF (congestive heart failure) (HCC)    Chronic, stable Followed by cardiology EKG completed EKG noting Vpaced rhythm; HR in 60s No changes from previous EKG in 2011      Relevant Medications   ENTRESTO 24-26 MG   Other Relevant Orders   EKG 12-Lead     Other   Welcome to Medicare preventive visit - Primary    No concerns; EKG completed for hx of CHF Declines vaccines at this time Due for mammogram this Fall Due for bone density screening; agreeable No longer needs PAP smears       Relevant Orders   EKG 12-Lead  Return in about 3 months (around 07/15/2022) for chonic disease management.    Vonna Kotyk, FNP, have reviewed all documentation for this visit. The documentation on 04/16/22 for the exam, diagnosis, procedures, and orders are all accurate and complete.  Vanessa Cohen, [place] [phone] (phone) [phone] (fax)  [place]

## 2022-04-16 ENCOUNTER — Ambulatory Visit (INDEPENDENT_AMBULATORY_CARE_PROVIDER_SITE_OTHER): Payer: Medicare PPO | Admitting: Family Medicine

## 2022-04-16 ENCOUNTER — Encounter: Payer: Self-pay | Admitting: Family Medicine

## 2022-04-16 VITALS — BP 131/82 | HR 72 | Temp 98.1°F | Ht 67.5 in | Wt 153.3 lb

## 2022-04-16 DIAGNOSIS — Z0000 Encounter for general adult medical examination without abnormal findings: Secondary | ICD-10-CM | POA: Diagnosis not present

## 2022-04-16 DIAGNOSIS — I5022 Chronic systolic (congestive) heart failure: Secondary | ICD-10-CM

## 2022-04-16 NOTE — Assessment & Plan Note (Signed)
No concerns; EKG completed for hx of CHF Declines vaccines at this time Due for mammogram this Fall Due for bone density screening; agreeable No longer needs PAP smears

## 2022-04-16 NOTE — Assessment & Plan Note (Signed)
Chronic, stable Followed by cardiology EKG completed EKG noting Vpaced rhythm; HR in 60s No changes from previous EKG in 2011

## 2022-05-02 ENCOUNTER — Encounter: Payer: Self-pay | Admitting: Family Medicine

## 2022-05-04 ENCOUNTER — Other Ambulatory Visit: Payer: Self-pay

## 2022-05-04 DIAGNOSIS — J301 Allergic rhinitis due to pollen: Secondary | ICD-10-CM

## 2022-05-04 MED ORDER — FLUTICASONE PROPIONATE 50 MCG/ACT NA SUSP: NASAL | 5 refills | Status: DC

## 2022-07-03 DIAGNOSIS — D1801 Hemangioma of skin and subcutaneous tissue: Secondary | ICD-10-CM | POA: Diagnosis not present

## 2022-07-03 DIAGNOSIS — L821 Other seborrheic keratosis: Secondary | ICD-10-CM | POA: Diagnosis not present

## 2022-07-03 DIAGNOSIS — I788 Other diseases of capillaries: Secondary | ICD-10-CM | POA: Diagnosis not present

## 2022-07-03 DIAGNOSIS — Z08 Encounter for follow-up examination after completed treatment for malignant neoplasm: Secondary | ICD-10-CM | POA: Diagnosis not present

## 2022-07-03 DIAGNOSIS — L814 Other melanin hyperpigmentation: Secondary | ICD-10-CM | POA: Diagnosis not present

## 2022-07-03 DIAGNOSIS — L738 Other specified follicular disorders: Secondary | ICD-10-CM | POA: Diagnosis not present

## 2022-07-03 DIAGNOSIS — Z86006 Personal history of melanoma in-situ: Secondary | ICD-10-CM | POA: Diagnosis not present

## 2022-07-03 DIAGNOSIS — L668 Other cicatricial alopecia: Secondary | ICD-10-CM | POA: Diagnosis not present

## 2022-07-03 DIAGNOSIS — L718 Other rosacea: Secondary | ICD-10-CM | POA: Diagnosis not present

## 2022-07-10 DIAGNOSIS — I429 Cardiomyopathy, unspecified: Secondary | ICD-10-CM | POA: Diagnosis not present

## 2022-07-10 DIAGNOSIS — I10 Essential (primary) hypertension: Secondary | ICD-10-CM | POA: Diagnosis not present

## 2022-07-10 DIAGNOSIS — Z950 Presence of cardiac pacemaker: Secondary | ICD-10-CM | POA: Diagnosis not present

## 2022-07-10 DIAGNOSIS — I5022 Chronic systolic (congestive) heart failure: Secondary | ICD-10-CM | POA: Diagnosis not present

## 2022-07-15 NOTE — Progress Notes (Unsigned)
Established patient visit  Patient: Vanessa Cohen   DOB: 10-17-1955   67 y.o. Female  MRN: [id number] Visit Date: 07/16/2022  Today's healthcare provider: Jacky Kindle, FNP  Introduced to nurse practitioner role and practice setting.  All questions answered.  Discussed provider/patient relationship and expectations.  Chief Complaint  Patient presents with   Chronic Disease Follow-Up   Subjective    Congestive Heart Failure Presents for follow-up visit.    Medications: Outpatient Medications Prior to Visit  Medication Sig   carvedilol (COREG) 12.5 MG tablet Take 6.25 mg by mouth 2 (two) times daily.   cetirizine (ZYRTEC) 10 MG tablet Take 1 tablet (10 mg total) by mouth daily.   dutasteride (AVODART) 0.5 MG capsule    ENTRESTO 24-26 MG Take 1 tablet by mouth 2 (two) times daily.   fluticasone (FLONASE) 50 MCG/ACT nasal spray SPRAY 2 SPRAY IN EACH NOSRTIL DAILY   furosemide (LASIX) 20 MG tablet Taking 10 mg every other day   Multiple Vitamin (MULTIVITAMIN ADULT PO) Take 1 tablet by mouth daily.   [DISCONTINUED] azelastine (OPTIVAR) 0.05 % ophthalmic solution Place 1 drop into both eyes 2 (two) times daily.   [DISCONTINUED] ramipril (ALTACE) 10 MG capsule Take 10 mg by mouth daily.   (Patient not taking: Reported on 04/16/2022)   No facility-administered medications prior to visit.   Review of Systems    Objective    BP 122/78 Comment: at cards  Pulse 70   Ht 5' 7.5" (1.715 m)   Wt 150 lb (68 kg)   SpO2 97%   BMI 23.15 kg/m   Physical Exam Vitals and nursing note reviewed.  Constitutional:      General: She is awake. She is not in acute distress.    Appearance: Normal appearance. She is well-developed, well-groomed and normal weight. She is not ill-appearing, toxic-appearing or diaphoretic.  HENT:     Head: Normocephalic and atraumatic.     Jaw: There is normal jaw occlusion. No trismus, tenderness, swelling or pain on movement.     Right Ear: Hearing, tympanic  membrane, ear canal and external ear normal. There is no impacted cerumen.     Left Ear: Hearing, tympanic membrane, ear canal and external ear normal. There is no impacted cerumen.     Nose: Nose normal. No congestion or rhinorrhea.     Right Turbinates: Not enlarged, swollen or pale.     Left Turbinates: Not enlarged, swollen or pale.     Right Sinus: No maxillary sinus tenderness or frontal sinus tenderness.     Left Sinus: No maxillary sinus tenderness or frontal sinus tenderness.     Mouth/Throat:     Lips: Pink.     Mouth: Mucous membranes are moist. No injury.     Tongue: No lesions.     Pharynx: Oropharynx is clear. Uvula midline. No pharyngeal swelling, oropharyngeal exudate, posterior oropharyngeal erythema or uvula swelling.     Tonsils: No tonsillar exudate or tonsillar abscesses.  Eyes:     General: Lids are normal. Lids are everted, no foreign bodies appreciated. Vision grossly intact. Gaze aligned appropriately. No allergic shiner or visual field deficit.       Right eye: No discharge.        Left eye: No discharge.     Extraocular Movements: Extraocular movements intact.     Conjunctiva/sclera: Conjunctivae normal.     Right eye: Right conjunctiva is not injected. No exudate.    Left eye: Left conjunctiva is not  injected. No exudate.    Pupils: Pupils are equal, round, and reactive to light.  Neck:     Thyroid: No thyroid mass, thyromegaly or thyroid tenderness.     Vascular: No carotid bruit.     Trachea: Trachea normal.  Cardiovascular:     Rate and Rhythm: Normal rate and regular rhythm.     Pulses: Normal pulses.          Carotid pulses are 2+ on the right side and 2+ on the left side.      Radial pulses are 2+ on the right side and 2+ on the left side.       Dorsalis pedis pulses are 2+ on the right side and 2+ on the left side.       Posterior tibial pulses are 2+ on the right side and 2+ on the left side.     Heart sounds: Normal heart sounds, S1 normal and S2  normal. No murmur heard.    No friction rub. No gallop.  Pulmonary:     Effort: Pulmonary effort is normal. No respiratory distress.     Breath sounds: Normal breath sounds and air entry. No stridor. No wheezing, rhonchi or rales.  Chest:     Chest wall: No tenderness.  Abdominal:     General: Abdomen is flat. Bowel sounds are normal. There is no distension.     Palpations: Abdomen is soft. There is no mass.     Tenderness: There is no abdominal tenderness. There is no right CVA tenderness, left CVA tenderness, guarding or rebound.     Hernia: No hernia is present.  Genitourinary:    Comments: Exam deferred; denies complaints Musculoskeletal:        General: No swelling, tenderness, deformity or signs of injury. Normal range of motion.     Cervical back: Full passive range of motion without pain, normal range of motion and neck supple. No edema, rigidity or tenderness. No muscular tenderness.     Right lower leg: No edema.     Left lower leg: No edema.  Lymphadenopathy:     Cervical: No cervical adenopathy.     Right cervical: No superficial, deep or posterior cervical adenopathy.    Left cervical: No superficial, deep or posterior cervical adenopathy.  Skin:    General: Skin is warm and dry.     Capillary Refill: Capillary refill takes less than 2 seconds.     Coloration: Skin is not jaundiced or pale.     Findings: No bruising, erythema, lesion or rash.  Neurological:     General: No focal deficit present.     Mental Status: She is alert and oriented to person, place, and time. Mental status is at baseline.     GCS: GCS eye subscore is 4. GCS verbal subscore is 5. GCS motor subscore is 6.     Cranial Nerves: No cranial nerve deficit.     Sensory: Sensation is intact. No sensory deficit.     Motor: Motor function is intact. No weakness.     Coordination: Coordination is intact. Coordination normal.     Gait: Gait is intact. Gait normal.  Psychiatric:        Attention and  Perception: Attention and perception normal.        Mood and Affect: Mood and affect normal.        Speech: Speech normal.        Behavior: Behavior normal. Behavior is cooperative.  Thought Content: Thought content normal.        Cognition and Memory: Cognition and memory normal.        Judgment: Judgment normal.    No results found for any visits on 07/16/22.  Assessment & Plan     Problem List Items Addressed This Visit       Cardiovascular and Mediastinum   Chronic systolic CHF (congestive heart failure) (HCC) - Primary    Chronic, well controlled Continues to follow up with cardiology BP previously stable Notes that she took meds on her way in this am Check CBC and CMP with Mg and Phos      Relevant Orders   Comprehensive Metabolic Panel (CMET)   CBC with Differential/Platelet   Lipid panel   Magnesium   Phosphorus   Hypertension    Chronic, previously well controlled Continue coreg 6.25 mg; entresto 24-26, lasix 10 mg  Goal <139/<79        Respiratory   Allergic rhinitis    Acute, recent flare Request for eye drops Zyrtec 10 mg, flonase 2 sprays and eye drops Continue to monitor s/s       Relevant Medications   azelastine (OPTIVAR) 0.05 % ophthalmic solution     Musculoskeletal and Integument   Tick bite of thigh    Acute, with 1/2 dollar size rash Pt defers labs; recommend abx given increase in size without other symptoms given CHF      Relevant Medications   doxycycline (VIBRA-TABS) 100 MG tablet     Other   Hypercholesteremia    Chronic, previously elevated Repeat LP LDL goal <100 recommend diet low in saturated fat and regular exercise - 30 min at least 5 times per week Not on statin per pt preference       Return in about 9 months (around 04/18/2023) for annual examination.     Vanessa Merl, FNP, have reviewed all documentation for this visit. The documentation on 07/16/22 for the exam, diagnosis, procedures, and orders are all  accurate and complete.  Vanessa Kindle, FNP  [facility] [phone] (phone) [phone] (fax)  [facility]

## 2022-07-16 ENCOUNTER — Ambulatory Visit: Payer: Medicare PPO | Admitting: Family Medicine

## 2022-07-16 ENCOUNTER — Encounter: Payer: Self-pay | Admitting: Family Medicine

## 2022-07-16 VITALS — BP 122/78 | HR 70 | Ht 67.5 in | Wt 150.0 lb

## 2022-07-16 DIAGNOSIS — S70369A Insect bite (nonvenomous), unspecified thigh, initial encounter: Secondary | ICD-10-CM | POA: Insufficient documentation

## 2022-07-16 DIAGNOSIS — I5022 Chronic systolic (congestive) heart failure: Secondary | ICD-10-CM | POA: Diagnosis not present

## 2022-07-16 DIAGNOSIS — W57XXXA Bitten or stung by nonvenomous insect and other nonvenomous arthropods, initial encounter: Secondary | ICD-10-CM | POA: Diagnosis not present

## 2022-07-16 DIAGNOSIS — J301 Allergic rhinitis due to pollen: Secondary | ICD-10-CM

## 2022-07-16 DIAGNOSIS — H1013 Acute atopic conjunctivitis, bilateral: Secondary | ICD-10-CM | POA: Insufficient documentation

## 2022-07-16 DIAGNOSIS — R7303 Prediabetes: Secondary | ICD-10-CM | POA: Insufficient documentation

## 2022-07-16 DIAGNOSIS — I10 Essential (primary) hypertension: Secondary | ICD-10-CM | POA: Diagnosis not present

## 2022-07-16 DIAGNOSIS — E7800 Pure hypercholesterolemia, unspecified: Secondary | ICD-10-CM

## 2022-07-16 MED ORDER — AZELASTINE HCL 0.05 % OP SOLN: 1.0000 [drp] | Freq: Two times a day (BID) | OPHTHALMIC | 12 refills | Status: DC

## 2022-07-16 MED ORDER — DOXYCYCLINE HYCLATE 100 MG PO TABS: 100.0000 mg | ORAL_TABLET | Freq: Two times a day (BID) | ORAL | 0 refills | Status: DC

## 2022-07-16 NOTE — Assessment & Plan Note (Signed)
Acute, recent flare Request for eye drops Zyrtec 10 mg, flonase 2 sprays and eye drops Continue to monitor s/s

## 2022-07-16 NOTE — Patient Instructions (Signed)
The CDC recommends two doses of Shingrix (the new shingles vaccine) separated by 2 to 6 months for adults age 66 years and older. I recommend checking with your insurance plan regarding coverage for this vaccine.    

## 2022-07-16 NOTE — Assessment & Plan Note (Signed)
Acute, with 1/2 dollar size rash Pt defers labs; recommend abx given increase in size without other symptoms given CHF

## 2022-07-16 NOTE — Assessment & Plan Note (Signed)
Chronic, well controlled Continues to follow up with cardiology BP previously stable Notes that she took meds on her way in this am Check CBC and CMP with Mg and Phos

## 2022-07-16 NOTE — Assessment & Plan Note (Signed)
Chronic, previously well controlled Continue coreg 6.25 mg; entresto 24-26, lasix 10 mg  Goal <139/<79

## 2022-07-16 NOTE — Assessment & Plan Note (Signed)
Chronic, previously elevated Repeat LP LDL goal <100 recommend diet low in saturated fat and regular exercise - 30 min at least 5 times per week Not on statin per pt preference

## 2022-07-17 LAB — COMPREHENSIVE METABOLIC PANEL
ALT: 14 IU/L (ref 0–32)
AST: 17 IU/L (ref 0–40)
Albumin/Globulin Ratio: 1.8 (ref 1.2–2.2)
Albumin: 4.3 g/dL (ref 3.9–4.9)
Alkaline Phosphatase: 78 IU/L (ref 44–121)
BUN/Creatinine Ratio: 23 (ref 12–28)
BUN: 20 mg/dL (ref 8–27)
Bilirubin Total: 0.3 mg/dL (ref 0.0–1.2)
CO2: 21 mmol/L (ref 20–29)
Calcium: 9.2 mg/dL (ref 8.7–10.3)
Chloride: 104 mmol/L (ref 96–106)
Creatinine, Ser: 0.86 mg/dL (ref 0.57–1.00)
Globulin, Total: 2.4 g/dL (ref 1.5–4.5)
Glucose: 84 mg/dL (ref 70–99)
Potassium: 4.4 mmol/L (ref 3.5–5.2)
Sodium: 141 mmol/L (ref 134–144)
Total Protein: 6.7 g/dL (ref 6.0–8.5)
eGFR: 75 mL/min/{1.73_m2} (ref >59)

## 2022-07-17 LAB — LIPID PANEL
Chol/HDL Ratio: 3.1 ratio (ref 0.0–4.4)
Cholesterol, Total: 223 mg/dL — ABNORMAL HIGH (ref 100–199)
HDL: 71 mg/dL (ref >39)
LDL Chol Calc (NIH): 138 mg/dL — ABNORMAL HIGH (ref 0–99)
Triglycerides: 83 mg/dL (ref 0–149)
VLDL Cholesterol Cal: 14 mg/dL (ref 5–40)

## 2022-07-17 LAB — CBC WITH DIFFERENTIAL/PLATELET
Basophils Absolute: 0.1 10*3/uL (ref 0.0–0.2)
Basos: 1 %
EOS (ABSOLUTE): 0.3 10*3/uL (ref 0.0–0.4)
Eos: 7 %
Hematocrit: 36.8 % (ref 34.0–46.6)
Hemoglobin: 11.7 g/dL (ref 11.1–15.9)
Immature Grans (Abs): 0 10*3/uL (ref 0.0–0.1)
Immature Granulocytes: 0 %
Lymphocytes Absolute: 1.5 10*3/uL (ref 0.7–3.1)
Lymphs: 34 %
MCH: 27.6 pg (ref 26.6–33.0)
MCHC: 31.8 g/dL (ref 31.5–35.7)
MCV: 87 fL (ref 79–97)
Monocytes Absolute: 0.4 10*3/uL (ref 0.1–0.9)
Monocytes: 8 %
Neutrophils Absolute: 2.1 10*3/uL (ref 1.4–7.0)
Neutrophils: 50 %
Platelets: 201 10*3/uL (ref 150–450)
RBC: 4.24 x10E6/uL (ref 3.77–5.28)
RDW: 13.3 % (ref 11.7–15.4)
WBC: 4.4 10*3/uL (ref 3.4–10.8)

## 2022-07-17 LAB — PHOSPHORUS: Phosphorus: 3.5 mg/dL (ref 3.0–4.3)

## 2022-07-17 LAB — MAGNESIUM: Magnesium: 2.1 mg/dL (ref 1.6–2.3)

## 2022-07-17 NOTE — Progress Notes (Signed)
The 10-year ASCVD risk score (Arnett DK, et al., 2019) is: 6.5% Slight improvement in LDL and total cholesterol. Moderate risk of heart attack/stroke remains. I continue to recommend diet low in saturated fat and regular exercise - 30 min at least 5 times per week  All other labs are normal/stable.

## 2022-08-25 DIAGNOSIS — I5032 Chronic diastolic (congestive) heart failure: Secondary | ICD-10-CM | POA: Diagnosis not present

## 2022-09-09 DIAGNOSIS — Z79899 Other long term (current) drug therapy: Secondary | ICD-10-CM | POA: Diagnosis not present

## 2022-09-09 DIAGNOSIS — I5022 Chronic systolic (congestive) heart failure: Secondary | ICD-10-CM | POA: Diagnosis not present

## 2022-09-09 DIAGNOSIS — J449 Chronic obstructive pulmonary disease, unspecified: Secondary | ICD-10-CM | POA: Diagnosis not present

## 2022-09-09 DIAGNOSIS — I429 Cardiomyopathy, unspecified: Secondary | ICD-10-CM | POA: Diagnosis not present

## 2022-09-09 DIAGNOSIS — I10 Essential (primary) hypertension: Secondary | ICD-10-CM | POA: Diagnosis not present

## 2022-09-09 DIAGNOSIS — Z45010 Encounter for checking and testing of cardiac pacemaker pulse generator [battery]: Secondary | ICD-10-CM | POA: Diagnosis not present

## 2022-10-06 ENCOUNTER — Other Ambulatory Visit: Payer: Self-pay

## 2022-10-06 ENCOUNTER — Encounter: Payer: Self-pay | Admitting: Cardiology

## 2022-10-06 ENCOUNTER — Ambulatory Visit
Admission: RE | Admit: 2022-10-06 | Discharge: 2022-10-06 | Disposition: A | Discharge status: Home / Self Care | Payer: Medicare PPO | Attending: Cardiology | Admitting: Cardiology

## 2022-10-06 ENCOUNTER — Encounter
Admission: RE | Disposition: A | Discharge status: Home / Self Care | Payer: Self-pay | Source: Home / Self Care | Attending: Cardiology

## 2022-10-06 DIAGNOSIS — I5022 Chronic systolic (congestive) heart failure: Secondary | ICD-10-CM | POA: Diagnosis not present

## 2022-10-06 DIAGNOSIS — Z45010 Encounter for checking and testing of cardiac pacemaker pulse generator [battery]: Secondary | ICD-10-CM | POA: Diagnosis not present

## 2022-10-06 DIAGNOSIS — Z79899 Other long term (current) drug therapy: Secondary | ICD-10-CM | POA: Insufficient documentation

## 2022-10-06 DIAGNOSIS — I110 Hypertensive heart disease with heart failure: Secondary | ICD-10-CM | POA: Insufficient documentation

## 2022-10-06 DIAGNOSIS — I420 Dilated cardiomyopathy: Secondary | ICD-10-CM | POA: Insufficient documentation

## 2022-10-06 HISTORY — PX: PPM GENERATOR CHANGEOUT: EP1233

## 2022-10-06 SURGERY — PPM GENERATOR CHANGEOUT
Anesthesia: Moderate Sedation

## 2022-10-06 MED ORDER — CEFAZOLIN SODIUM-DEXTROSE 1-4 GM/50ML-% IV SOLN
INTRAVENOUS | Status: AC | PRN
  Administered 2022-10-06: 2 g via INTRAVENOUS

## 2022-10-06 MED ORDER — ACETAMINOPHEN 325 MG PO TABS: 325.0000 mg | ORAL_TABLET | ORAL | Status: DC | PRN

## 2022-10-06 MED ORDER — SODIUM CHLORIDE 0.9 % IV SOLN
INTRAVENOUS | Status: DC | PRN
  Administered 2022-10-06: 80 mg

## 2022-10-06 MED ORDER — LIDOCAINE HCL 1 % IJ SOLN
INTRAMUSCULAR | Status: AC
  Filled 2022-10-06: qty 60

## 2022-10-06 MED ORDER — MIDAZOLAM HCL 2 MG/2ML IJ SOLN
INTRAMUSCULAR | Status: AC
  Filled 2022-10-06: qty 2

## 2022-10-06 MED ORDER — SODIUM CHLORIDE 0.9 % IV SOLN: INTRAVENOUS | Status: DC

## 2022-10-06 MED ORDER — ONDANSETRON HCL 4 MG/2ML IJ SOLN: 4.0000 mg | Freq: Four times a day (QID) | INTRAMUSCULAR | Status: DC | PRN

## 2022-10-06 MED ORDER — LIDOCAINE HCL (PF) 1 % IJ SOLN
INTRAMUSCULAR | Status: DC | PRN
  Administered 2022-10-06: 30 mL

## 2022-10-06 MED ORDER — MIDAZOLAM HCL 2 MG/2ML IJ SOLN
INTRAMUSCULAR | Status: DC | PRN
  Administered 2022-10-06: 1 mg via INTRAVENOUS

## 2022-10-06 MED ORDER — FENTANYL CITRATE (PF) 100 MCG/2ML IJ SOLN
INTRAMUSCULAR | Status: AC
  Filled 2022-10-06: qty 2

## 2022-10-06 MED ORDER — SODIUM CHLORIDE 0.9 % IV SOLN
80.0000 mg | INTRAVENOUS | Status: DC
  Filled 2022-10-06: qty 2

## 2022-10-06 MED ORDER — FENTANYL CITRATE (PF) 100 MCG/2ML IJ SOLN
INTRAMUSCULAR | Status: DC | PRN
  Administered 2022-10-06: 25 ug via INTRAVENOUS

## 2022-10-06 MED ORDER — CEPHALEXIN 500 MG PO CAPS: 1000.0000 mg | ORAL_CAPSULE | Freq: Two times a day (BID) | ORAL | 0 refills | Status: DC

## 2022-10-06 MED ORDER — CEFAZOLIN SODIUM-DEXTROSE 2-4 GM/100ML-% IV SOLN: 2.0000 g | INTRAVENOUS | Status: DC

## 2022-10-06 MED ORDER — CEFAZOLIN SODIUM-DEXTROSE 2-4 GM/100ML-% IV SOLN
INTRAVENOUS | Status: AC
  Filled 2022-10-06: qty 100

## 2022-10-06 SURGICAL SUPPLY — 13 items
DEVICE DSSCT PLSMBLD 3.0S LGHT (MISCELLANEOUS) IMPLANT
DRAPE INCISE 23X17 STRL (DRAPES) IMPLANT
DRAPE INCISE IOBAN 23X17 STRL (DRAPES) ×1 IMPLANT
PACEMAKER PRCT MRI CRTP W1TR01 (Pacemaker) IMPLANT
PAD ELECT DEFIB RADIOL ZOLL (MISCELLANEOUS) IMPLANT
PLASMABLADE 3.0S W/LIGHT (MISCELLANEOUS) ×1
POUCH AIGIS-R ANTIBACT PPM (Mesh General) ×1 IMPLANT
POUCH AIGIS-R ANTIBACT PPM MED (Mesh General) IMPLANT
PPM PRECEPTA MRI CRT-P W1TR01 (Pacemaker) ×1 IMPLANT
SUT VIC AB 2-0 CT2 27 (SUTURE) IMPLANT
SUT VICRYL 4-0 27 PS-2 BARIAT (SUTURE) ×1
SUTURE VICRYL 4-0 27 PS-2 BART (SUTURE) IMPLANT
TRAY PACEMAKER INSERTION (PACKS) ×1 IMPLANT

## 2022-10-06 NOTE — Discharge Instructions (Addendum)
Patient may shower 10/08/2022.  Patient may remove outer bandage after shower, leave Steri-Strips on.  Follow up with Dr. Darrold Junker in clinic on August 1st at 10:45Pacemaker Battery Change, Care After   This sheet gives you information about how to care for yourself after your procedure. Your health care provider may also give you more specific instructions. If you have problems or questions, contact your health care provider. What can I expect after the procedure? After the procedure, it is common to have these symptoms at the site where the pacemaker was inserted: Mild pain or soreness. Slight bruising. Some swelling over the incisions. A slight bump over the skin where the device was placed (if it was implanted in the upper chest area). Sometimes, it is possible to feel the device under the skin. This is normal. Follow these instructions at home: Incision care  Keep the incision clean and dry for 2-3 days after the procedure or as told by your health care provider. It takes several weeks for the incision site to completely heal. Do not remove the bandage (dressing) on your chest until told to do so by your health care provider. Leave stitches (sutures), skin glue, or adhesive strips in place. These skin closures may need to stay in place for 2 weeks or longer. If adhesive strip edges start to loosen and curl up, you may trim the loose edges. Do not remove adhesive strips completely unless your health care provider tells you to do that. You may shower in 2 days Pat the incision area dry with a clean towel. Do not rub the area. This may cause bleeding. Check your incision area every day for signs of infection. Check for: More redness, swelling, or pain. Fluid or blood. Warmth. Pus or a bad smell. Avoid putting pressure on the area where the pacemaker was placed. Women may want to place a small pad over the incision site to protect it from their bra strap. Avoid using lotion on skin until  insertion site heals completely Medicines Take over-the-counter and prescription medicines only as told by your health care provider. If you were prescribed an antibiotic medicine, take it as told by your health care provider. Do not stop taking the antibiotic even if you start to feel better. Activity For the first 2 weeks, or as long as told by your health care provider: Avoid lifting anything heavy Avoid exercise or activities that take a lot of effort. If you were given a medicine to help you relax (sedative) during the procedure, it can affect you for several hours. Do not drive or operate machinery until your health care provider says that it is safe. General instructions Do not use any products that contain nicotine or tobacco, such as cigarettes, e-cigarettes, and chewing tobacco. These can delay incision healing after surgery. If you need help quitting, ask your health care provider. Always let all health care providers, including dentists, know about your pacemaker before you have any medical procedures or tests. You may be shown how to transfer data from your pacemaker through the phone to your health care provider. Wear a medical ID bracelet or necklace stating that you have a pacemaker, and carry a pacemaker ID card with you at all times. Avoid close and prolonged exposure to electrical devices that have strong magnetic fields. These include: Insurance claims handler. When at the airport, let officials know that you have a pacemaker. Carry your pacemaker ID card. Metal detectors. If you must pass through a metal detector, walk  through it quickly. Do not stop under the detector or stand near it. When using your mobile phone, hold it to the ear opposite the pacemaker. Do not leave your mobile phone in a pocket over the pacemaker. Your pacemaker battery will last for 5-15 years. Your health care provider will do routine checks to know when the battery is starting to run down. When  this happens, the pacemaker will need to be replaced. Keep all follow-up visits as told by your health care provider. This is important. Contact a health care provider if: You have pain at the incision site that is not relieved by medicines. You have any of these signs of infection: More redness, swelling, or pain around your incision. Fluid or blood coming from your incision. Warmth coming from your incision. Pus or a bad smell coming from your incision. A fever. You feel brief, occasional palpitations, light-headedness, or any symptoms that you think might be related to your heart. Get help right away if: You have chest pain that is different from the pain at the pacemaker site. You develop a red streak that extends above or below the incision site. You have shortness of breath. You have palpitations or an irregular heartbeat. You have light-headedness that does not go away quickly. You faint or have dizzy spells. Your pulse suddenly drops or increases rapidly and does not return to normal. You gain weight and your legs and ankles swell. Summary After the procedure, it is common to have pain, soreness, and some swelling or bruising where the pacemaker was inserted. Keep your incision clean and dry. Follow instructions from your health care provider about how to take care of your incision. Check your incision every day for signs of infection, such as more pain or swelling, pus or a bad smell, warmth, or leaking fluid or blood. Carry a pacemaker ID card with you at all times. This information is not intended to replace advice given to you by your health care provider. Make sure you discuss any questions you have with your health care provider. Document Revised: 02/02/2019 Document Reviewed: 02/02/2019 Elsevier Patient Education  2023 [employer].

## 2022-10-07 ENCOUNTER — Encounter: Payer: Self-pay | Admitting: Cardiology

## 2022-10-15 DIAGNOSIS — I10 Essential (primary) hypertension: Secondary | ICD-10-CM | POA: Diagnosis not present

## 2022-10-15 DIAGNOSIS — Z45018 Encounter for adjustment and management of other part of cardiac pacemaker: Secondary | ICD-10-CM | POA: Diagnosis not present

## 2022-10-15 DIAGNOSIS — I5022 Chronic systolic (congestive) heart failure: Secondary | ICD-10-CM | POA: Diagnosis not present

## 2022-10-15 DIAGNOSIS — I429 Cardiomyopathy, unspecified: Secondary | ICD-10-CM | POA: Diagnosis not present

## 2022-12-10 ENCOUNTER — Encounter: Payer: Self-pay | Admitting: Family Medicine

## 2022-12-10 ENCOUNTER — Ambulatory Visit: Payer: Medicare PPO | Admitting: Family Medicine

## 2022-12-10 VITALS — BP 119/61 | HR 73 | Ht 67.5 in | Wt 152.3 lb

## 2022-12-10 DIAGNOSIS — L740 Miliaria rubra: Secondary | ICD-10-CM | POA: Insufficient documentation

## 2022-12-10 MED ORDER — NYSTATIN-TRIAMCINOLONE 100000-0.1 UNIT/GM-% EX OINT
1.0000 | TOPICAL_OINTMENT | Freq: Two times a day (BID) | CUTANEOUS | 0 refills | Status: AC
Start: 2022-12-10 — End: ?

## 2022-12-10 NOTE — Assessment & Plan Note (Signed)
Acute, self limiting Recommend topical antifungal with low dose steroid to assist itching Continue supportive care measures and aim to keep site dry F/u as needed

## 2022-12-10 NOTE — Progress Notes (Signed)
Established patient visit   Patient: Vanessa Cohen   DOB: [birth year]   66 y.o. Female  MRN: [id number] Visit Date: 12/10/2022  Today's healthcare provider: Jacky Kindle, FNP  Introduced to nurse practitioner role and practice setting.  All questions answered.  Discussed provider/patient relationship and expectations.  Subjective    Rash   HPI     Rash    Additional comments: Present since Sunday. Patient reports located above her buttock and is red, itchy irritated and looks raised some. She has been using polysporin and benadryl creme twice daily.       Last edited by Acey Lav, CMA on 12/10/2022 10:28 AM.      Medications: Outpatient Medications Prior to Visit  Medication Sig   azelastine (OPTIVAR) 0.05 % ophthalmic solution Place 1 drop into both eyes 2 (two) times daily. (Patient taking differently: Place 1 drop into both eyes 2 (two) times daily as needed.)   carvedilol (COREG) 12.5 MG tablet Take 6.25 mg by mouth 2 (two) times daily.   cetirizine (ZYRTEC) 10 MG tablet Take 1 tablet (10 mg total) by mouth daily.   dutasteride (AVODART) 0.5 MG capsule 0.5 mg daily.   ENTRESTO 24-26 MG Take 1 tablet by mouth 2 (two) times daily.   fluticasone (FLONASE) 50 MCG/ACT nasal spray SPRAY 2 SPRAY IN [facility] NOSRTIL DAILY (Patient taking differently: 1 spray 2 (two) times daily as needed. SPRAY 2 SPRAY IN EACH NOSRTIL DAILY)   furosemide (LASIX) 20 MG tablet Taking 10 mg every other day   Multiple Vitamin (MULTIVITAMIN ADULT PO) Take 1 tablet by mouth daily.   [DISCONTINUED] cephALEXin (KEFLEX) 500 MG capsule Take 2 capsules (1,000 mg total) by mouth 2 (two) times daily.   No facility-administered medications prior to visit.    Review of Systems  Skin:  Positive for rash.        Objective    BP 119/61 (BP Location: Left Arm, Patient Position: Sitting, Cuff Size: Normal)   Pulse 73   Ht 5' 7.5" (1.715 m)   Wt 152 lb 4.8 oz (69.1 kg)   SpO2 100%   BMI 23.50 kg/m      Physical Exam Constitutional:      General: She is not in acute distress.    Appearance: Normal appearance. She is normal weight. She is not ill-appearing, toxic-appearing or diaphoretic.  Cardiovascular:     Rate and Rhythm: Normal rate.  Pulmonary:     Effort: Pulmonary effort is normal.  Musculoskeletal:        General: Normal range of motion.  Skin:    General: Skin is warm and dry.     Findings: Rash present.          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     Mental Status: She is alert and oriented to person, place, and time. Mental status is at baseline.      No results found for any visits on 12/10/22.  Assessment & Plan     Problem List Items Addressed This Visit       Musculoskeletal and Integument   Heat rash - Primary    Acute, self limiting Recommend topical antifungal with low dose steroid to assist itching Continue supportive care measures and aim to keep site dry F/u as needed      No follow-ups on file.     Kathyrn Lass  Grier Mitts, FNP, have reviewed all documentation for this visit. The documentation on 12/10/22 for the exam, diagnosis, procedures, and orders are all accurate and complete.  Jacky Kindle, FNP  [facility] [phone] (phone) [phone] (fax)  [facility]

## 2023-01-15 DIAGNOSIS — I5022 Chronic systolic (congestive) heart failure: Secondary | ICD-10-CM | POA: Diagnosis not present

## 2023-01-15 DIAGNOSIS — I429 Cardiomyopathy, unspecified: Secondary | ICD-10-CM | POA: Diagnosis not present

## 2023-01-15 DIAGNOSIS — Z950 Presence of cardiac pacemaker: Secondary | ICD-10-CM | POA: Diagnosis not present

## 2023-01-15 DIAGNOSIS — I10 Essential (primary) hypertension: Secondary | ICD-10-CM | POA: Diagnosis not present

## 2023-01-22 ENCOUNTER — Encounter: Payer: Self-pay | Admitting: Family Medicine

## 2023-01-22 DIAGNOSIS — L6612 Frontal fibrosing alopecia: Secondary | ICD-10-CM | POA: Diagnosis not present

## 2023-01-22 DIAGNOSIS — L910 Hypertrophic scar: Secondary | ICD-10-CM | POA: Diagnosis not present

## 2023-01-22 DIAGNOSIS — L820 Inflamed seborrheic keratosis: Secondary | ICD-10-CM | POA: Diagnosis not present

## 2023-01-22 DIAGNOSIS — L821 Other seborrheic keratosis: Secondary | ICD-10-CM | POA: Diagnosis not present

## 2023-01-22 DIAGNOSIS — L814 Other melanin hyperpigmentation: Secondary | ICD-10-CM | POA: Diagnosis not present

## 2023-01-22 DIAGNOSIS — L648 Other androgenic alopecia: Secondary | ICD-10-CM | POA: Diagnosis not present

## 2023-01-22 DIAGNOSIS — L538 Other specified erythematous conditions: Secondary | ICD-10-CM | POA: Diagnosis not present

## 2023-01-27 ENCOUNTER — Other Ambulatory Visit: Payer: Self-pay | Admitting: Family Medicine

## 2023-01-27 MED ORDER — MINOXIDIL 2.5 MG PO TABS: 1.2500 mg | ORAL_TABLET | Freq: Every day | ORAL | 2 refills | Status: DC

## 2023-02-23 DIAGNOSIS — I5032 Chronic diastolic (congestive) heart failure: Secondary | ICD-10-CM | POA: Diagnosis not present

## 2023-04-22 ENCOUNTER — Ambulatory Visit: Payer: Medicare PPO | Admitting: Family Medicine

## 2023-04-22 ENCOUNTER — Encounter: Payer: Medicare PPO | Admitting: Family Medicine

## 2023-04-22 ENCOUNTER — Encounter: Payer: Self-pay | Admitting: Family Medicine

## 2023-04-22 VITALS — BP 138/83 | HR 65 | Resp 16 | Ht 67.5 in | Wt 151.0 lb

## 2023-04-22 DIAGNOSIS — Z0001 Encounter for general adult medical examination with abnormal findings: Secondary | ICD-10-CM

## 2023-04-22 DIAGNOSIS — Z1211 Encounter for screening for malignant neoplasm of colon: Secondary | ICD-10-CM

## 2023-04-22 DIAGNOSIS — I10 Essential (primary) hypertension: Secondary | ICD-10-CM

## 2023-04-22 DIAGNOSIS — E7800 Pure hypercholesterolemia, unspecified: Secondary | ICD-10-CM

## 2023-04-22 DIAGNOSIS — J069 Acute upper respiratory infection, unspecified: Secondary | ICD-10-CM

## 2023-04-22 DIAGNOSIS — I5022 Chronic systolic (congestive) heart failure: Secondary | ICD-10-CM | POA: Diagnosis not present

## 2023-04-22 DIAGNOSIS — M8580 Other specified disorders of bone density and structure, unspecified site: Secondary | ICD-10-CM | POA: Diagnosis not present

## 2023-04-22 DIAGNOSIS — N182 Chronic kidney disease, stage 2 (mild): Secondary | ICD-10-CM

## 2023-04-22 DIAGNOSIS — Z950 Presence of cardiac pacemaker: Secondary | ICD-10-CM | POA: Diagnosis not present

## 2023-04-22 DIAGNOSIS — Z0000 Encounter for general adult medical examination without abnormal findings: Secondary | ICD-10-CM

## 2023-04-22 DIAGNOSIS — Z1231 Encounter for screening mammogram for malignant neoplasm of breast: Secondary | ICD-10-CM

## 2023-04-22 NOTE — Patient Instructions (Signed)
 Recommend getting the Tdap vaccine at your local pharmacy

## 2023-04-22 NOTE — Progress Notes (Addendum)
 Complete physical exam   Patient: Vanessa Cohen   DOB: 08-14-1955   68 y.o. Female  MRN: [id number] Visit Date: 04/22/2023  Today's healthcare provider: LAURAINE LOISE BUOY, DO   Chief Complaint  Patient presents with   Medicare Wellness   Annual Exam    Patient overall feeling better. Reports that she had flu symptoms last week and only has the cough and congestion left. Health maintenance:  Prevnar: Declined today. Cologuard: Due-order placed. Dexa scan: order placed Mammogram: order placed.   Subjective    Vanessa Cohen is a 68 y.o. female who presents today for a complete physical exam.  She reports consuming a general diet but does generally avoid snack foods such as chips.  She tries to go out and walk with her dogs regularly.  She generally feels fairly well. She reports sleeping fairly well. She does not have additional problems to discuss today.  HPI HPI     Annual Exam    Additional comments: Patient overall feeling better. Reports that she had flu symptoms last week and only has the cough and congestion left. Health maintenance:  Prevnar: Declined today. Cologuard: Due-order placed. Dexa scan: order placed Mammogram: order placed.      Last edited by Rosas, Joseline E, CMA on 04/22/2023 10:43 AM.      Vanessa Cohen is a 68 year old female with congestive heart failure who presents for an annual physical exam with recent flu symptoms.  She has been experiencing flu-like symptoms since Thursday evening, including a low-grade fever of 100.33F that resolved by Saturday morning. She developed a productive cough and sinus congestion, which she finds difficult to clear. She uses a humidifier at night to aid sleep and has taken Mucinex, though she feels it has not been effective. She consulted a pharmacist about safe medications due to her history of congestive heart failure and mentioned taking a Mucinex formulation containing guaifenesin and possibly dextromethorphan,  but she did not take it yesterday or today. She continues to experience chest congestion and a productive cough.  She has a history of congestive heart failure and is cautious about medication interactions. She is currently taking carvedilol and furosemide. She reports minimal leg swelling, which occurs if she misses her furosemide dose. She follows up with her cardiologist every six months.  She has not received COVID-19 vaccinations or the shingles vaccine and is overdue for a tetanus vaccine, with her last one administered in 2012.  Her diet includes seasonal fruits, mixed nuts without salt, and locally sourced meats and eggs. She avoids high-salt snack foods and consumes frozen vegetables like broccoli. She engages in physical activity by walking her dogs and doing yard work. No chest pain, shortness of breath, abnormal fatigue, numbness, tingling, nausea, vomiting, dizziness, diarrhea, constipation, or ear pain. Occasional leg swelling if furosemide is missed.   Past Medical History:  Diagnosis Date   Allergy    Automatic implantable cardiac defibrillator in situ    Basal cell carcinoma    Cancer (HCC)    melanoma   Congestive heart failure, unspecified    Hypertension    Secondary cardiomyopathy, unspecified    Past Surgical History:  Procedure Laterality Date   APPENDECTOMY     BREAST BIOPSY Left    core bx- neg   BREAST BIOPSY Left 02/27/2021   site 1 1:00 venus, site 2 2:00 ribbon   PPM GENERATOR CHANGEOUT N/A 10/06/2022   Procedure: PPM GENERATOR CHANGEOUT;  Surgeon: Ammon Blunt, MD;  Location: [facility] INVASIVE CV LAB;  Service: Cardiovascular;  Laterality: N/A;   Social History   Socioeconomic History   Marital status: Single    Spouse name: Not on file   Number of children: Not on file   Years of education: Not on file   Highest education level: Associate degree: occupational, scientist, product/process development, or vocational program  Occupational History   Not on file  Tobacco Use    Smoking status: Never   Smokeless tobacco: Never  Vaping Use   Vaping status: Never Used  Substance and Sexual Activity   Alcohol use: Yes    Alcohol/week: 1.0 standard drink of alcohol    Types: 1 Glasses of wine per week    Comment: Not even Monthly   Drug use: No   Sexual activity: Not Currently  Other Topics Concern   Not on file  Social History Narrative   Full time. Does not regularly exercise.    Social Drivers of Corporate Investment Banker Strain: Low Risk  (04/22/2023)   Overall Financial Resource Strain (CARDIA)    Difficulty of Paying Living Expenses: Not very hard  Food Insecurity: No Food Insecurity (04/22/2023)   Hunger Vital Sign    Worried About Running Out of Food in the Last Year: Never true    Ran Out of Food in the Last Year: Never true  Transportation Needs: No Transportation Needs (04/22/2023)   PRAPARE - Administrator, Civil Service (Medical): No    Lack of Transportation (Non-Medical): No  Physical Activity: Sufficiently Active (04/22/2023)   Exercise Vital Sign    Days of Exercise per Week: 4 days    Minutes of Exercise per Session: 40 min  Stress: No Stress Concern Present (04/22/2023)   [employer] of Occupational Health - Occupational Stress Questionnaire    Feeling of Stress : Not at all  Social Connections: Moderately Integrated (04/22/2023)   Social Connection and Isolation Panel [NHANES]    Frequency of Communication with Friends and Family: Three times a week    Frequency of Social Gatherings with Friends and Family: Once a week    Attends Religious Services: More than 4 times per year    Active Member of [employer] or Organizations: Yes    Attends Engineer, Structural: More than 4 times per year    Marital Status: Divorced  Catering Manager Violence: Not on file   Family Status  Relation Name Status   Other  (Not Specified)   Mother Deceased Deceased   Father Deceased Deceased   Sister  Alive   Brother  Alive    Sister  Alive   Brother  Alive   Mat Aunt Deceased Alive   Neg Hx  (Not Specified)  No partnership data on file   Family History  Problem Relation Age of Onset   Stroke Other    Heart disease Mother    Alcohol abuse Mother    Cancer Father        lung cancer   ADD / ADHD Maternal Aunt    Breast cancer Neg Hx    Allergies  Allergen Reactions   Sulfa Antibiotics     Patient Care Team: Karena Kinker N, DO as PCP - General (Family Medicine)   Medications: Outpatient Medications Prior to Visit  Medication Sig   carvedilol (COREG) 12.5 MG tablet Take 6.25 mg by mouth 2 (two) times daily.   cetirizine  (ZYRTEC ) 10 MG tablet Take 1 tablet (10 mg total) by mouth daily.  dutasteride (AVODART) 0.5 MG capsule 0.5 mg daily.   ENTRESTO 24-26 MG Take 1 tablet by mouth 2 (two) times daily.   fluticasone  (FLONASE ) 50 MCG/ACT nasal spray SPRAY 2 SPRAY IN [facility] NOSRTIL DAILY (Patient taking differently: 1 spray 2 (two) times daily as needed. SPRAY 2 SPRAY IN EACH NOSRTIL DAILY)   furosemide (LASIX) 20 MG tablet Taking 10 mg every other day   minoxidil  (LONITEN ) 2.5 MG tablet Take 0.5 tablets (1.25 mg total) by mouth daily.   Multiple Vitamin (MULTIVITAMIN ADULT PO) Take 1 tablet by mouth daily.   nystatin -triamcinolone  ointment (MYCOLOG) Apply 1 Application topically 2 (two) times daily.   azelastine  (OPTIVAR ) 0.05 % ophthalmic solution Place 1 drop into both eyes 2 (two) times daily. (Patient taking differently: Place 1 drop into both eyes 2 (two) times daily as needed.)   No facility-administered medications prior to visit.       Objective    BP 138/83 (BP Location: Left Arm, Patient Position: Sitting, Cuff Size: Large)   Pulse 65   Resp 16   Ht 5' 7.5 (1.715 m)   Wt 151 lb (68.5 kg)   SpO2 98%   BMI 23.30 kg/m    Physical Exam Vitals and nursing note reviewed.  Constitutional:      General: She is awake.     Appearance: Normal appearance.  HENT:     Head: Normocephalic and  atraumatic.     Right Ear: Tympanic membrane, ear canal and external ear normal.     Left Ear: Tympanic membrane, ear canal and external ear normal.     Nose: Nose normal.     Mouth/Throat:     Mouth: Mucous membranes are moist.     Pharynx: Oropharynx is clear. No oropharyngeal exudate or posterior oropharyngeal erythema.  Eyes:     General: No scleral icterus.    Extraocular Movements: Extraocular movements intact.     Conjunctiva/sclera: Conjunctivae normal.     Pupils: Pupils are equal, round, and reactive to light.  Neck:     Thyroid: No thyromegaly or thyroid tenderness.  Cardiovascular:     Rate and Rhythm: Normal rate and regular rhythm.     Pulses: Normal pulses.     Heart sounds: Normal heart sounds.  Pulmonary:     Effort: Pulmonary effort is normal. No tachypnea, bradypnea or respiratory distress.     Breath sounds: Normal breath sounds. No stridor. No wheezing, rhonchi or rales.  Abdominal:     General: Bowel sounds are normal. There is no distension.     Palpations: Abdomen is soft. There is no mass.     Tenderness: There is no abdominal tenderness. There is no guarding.     Hernia: No hernia is present.  Musculoskeletal:     Cervical back: Normal range of motion and neck supple.     Right lower leg: No edema.     Left lower leg: No edema.  Lymphadenopathy:     Cervical: No cervical adenopathy.  Skin:    General: Skin is warm and dry.  Neurological:     Mental Status: She is alert and oriented to person, place, and time. Mental status is at baseline.  Psychiatric:        Mood and Affect: Mood normal.        Behavior: Behavior normal.       Last depression screening scores    04/22/2023   10:26 AM 07/16/2022    9:10 AM 04/16/2022    9:17  AM  PHQ 2/9 Scores  PHQ - 2 Score 0 0 0  PHQ- 9 Score  0 2   Last fall risk screening    04/22/2023   10:26 AM  Fall Risk   Falls in the past year? 0  Number falls in past yr: 0  Injury with Fall? 0  Risk for fall due  to : No Fall Risks   Last Audit-C alcohol use screening    04/18/2023   12:11 PM  Alcohol Use Disorder Test (AUDIT)  1. How often do you have a drink containing alcohol? 1  2. How many drinks containing alcohol do you have on a typical day when you are drinking? 0  3. How often do you have six or more drinks on one occasion? 0  AUDIT-C Score 1      Patient-reported   A score of 3 or more in women, and 4 or more in men indicates increased risk for alcohol abuse, EXCEPT if all of the points are from question 1   Results for orders placed or performed in visit on 04/22/23  Comprehensive metabolic panel  Result Value Ref Range   Glucose 95 70 - 99 mg/dL   BUN 16 8 - 27 mg/dL   Creatinine, Ser 9.18 0.57 - 1.00 mg/dL   eGFR 80 >40 fO/fpw/8.26   BUN/Creatinine Ratio 20 12 - 28   Sodium 143 134 - 144 mmol/L   Potassium 4.7 3.5 - 5.2 mmol/L   Chloride 102 96 - 106 mmol/L   CO2 27 20 - 29 mmol/L   Calcium 9.5 8.7 - 10.3 mg/dL   Total Protein 7.1 6.0 - 8.5 g/dL   Albumin 4.3 3.9 - 4.9 g/dL   Globulin, Total 2.8 1.5 - 4.5 g/dL   Bilirubin Total 0.4 0.0 - 1.2 mg/dL   Alkaline Phosphatase 74 44 - 121 IU/L   AST 20 0 - 40 IU/L   ALT 14 0 - 32 IU/L  Lipid panel  Result Value Ref Range   Cholesterol, Total 205 (H) 100 - 199 mg/dL   Triglycerides 885 0 - 149 mg/dL   HDL 48 >60 mg/dL   VLDL Cholesterol Cal 21 5 - 40 mg/dL   LDL Chol Calc (NIH) 863 (H) 0 - 99 mg/dL   Chol/HDL Ratio 4.3 0.0 - 4.4 ratio  Microalbumin / creatinine urine ratio  Result Value Ref Range   Creatinine, Urine 44.4 Not Estab. mg/dL   Microalbumin, Urine 4.9 Not Estab. ug/mL   Microalb/Creat Ratio 11 0 - 29 mg/g creat    Assessment & Plan    Routine Health Maintenance and Physical Exam  Exercise Activities and Dietary recommendations  Goals   None     Immunization History  Administered Date(s) Administered   Hepatitis B 05/30/1992, 07/04/1992, 01/02/1993   Influenza Split 01/03/2013   Influenza,inj,Quad  PF,6+ Mos 12/02/2018   Influenza-Unspecified 12/02/2018   MMR 04/18/2010   PPD Test 02/22/2007, 01/29/2014   Pneumococcal Polysaccharide-23 04/08/2013   Td 03/27/2005   Tdap 05/05/2010    Health Maintenance  Topic Date Due   DTaP/Tdap/Td (3 - Td or Tdap) 05/05/2020   MAMMOGRAM  01/23/2023   Fecal DNA (Cologuard)  02/19/2023   INFLUENZA VACCINE  06/14/2023 (Originally 10/15/2022)   Zoster Vaccines- Shingrix (1 of 2) 07/20/2023 (Originally 09/02/1975)   COVID-19 Vaccine (1) 11/15/2023 (Originally 09/01/1961)   Pneumonia Vaccine 91+ Years old (2 of 2 - PCV) 04/21/2024 (Originally 04/08/2014)   Medicare Annual Wellness (AWV)  07/16/2023  DEXA SCAN  Completed   Hepatitis C Screening  Completed   HPV VACCINES  Aged Out   Colonoscopy  Discontinued    Discussed health benefits of physical activity, and encouraged her to engage in regular exercise appropriate for her age and condition.   Annual physical exam Assessment & Plan: Physical exam overall unremarkable except as noted above. Routine lab work ordered as noted. Due for several vaccinations. Declined COVID-19 and shingles vaccines. Overdue for tetanus vaccine, last received in 2012. Discussed importance of shingles vaccine and risks associated with shingles reactivation, including potential impact on vision, hearing, and swallowing if it occurs on the face. Explained that shingles vaccine is more effective if administered before age 24. Encouraged to consider vaccination and inform pharmacy if interested. - Administer tetanus, diphtheria, pertussis (Tdap) vaccine - Document COVID-19 and shingles vaccine as declined - Encourage consideration of shingles vaccine and inform pharmacy if interested    Upper respiratory infection, viral Assessment & Plan: Reports flu-like symptoms starting Thursday evening with a low-grade fever (100.8F) lasting until Saturday morning. Symptoms include a productive cough and sinus congestion. History of  seasonal allergies may be contributing. Using Mucinex but feels it is not effective. Advised to monitor symptoms for potential pneumonia. - Continue Mucinex (guaifenesin) for chest congestion - Ensure adequate hydration - Rest - Consider chest x-ray if symptoms worsen to rule out pneumonia   Primary hypertension Assessment & Plan: Stable.  Continue carvedilol 12.5 mg bid, and entresto 24-26 mg bid  Orders: -     Comprehensive metabolic panel  Hypercholesteremia -     Lipid panel  Chronic systolic CHF (congestive heart failure) (HCC) Assessment & Plan: Currently on carvedilol and furosemide. Follows up with cardiologist every six months. Reports minimal leg swelling if furosemide is skipped. Discussed medication management and importance of adherence. - Continue carvedilol and furosemide - Follow up with cardiologist as scheduled   Cardiac pacemaker in situ Assessment & Plan: Noted. Followed by cardiologist; defer to specialist management.  No acute concerns. Continue to monitor.   Chronic kidney disease, stage 2, mildly decreased GFR -     Microalbumin / creatinine urine ratio  Osteopenia, unspecified location -     DG Bone Density; Future  Encounter for screening mammogram for breast cancer -     3D Screening Mammogram, Left and Right; Future  Encounter for colorectal cancer screening -     Cologuard    Return in about 6 months (around 10/20/2023) for chronic f/u.     I discussed the assessment and treatment plan with the patient  The patient was provided an opportunity to ask questions and all were answered. The patient agreed with the plan and demonstrated an understanding of the instructions.   The patient was advised to call back or seek an in-person evaluation if the symptoms worsen or if the condition fails to improve as anticipated.   LAURAINE LOISE BUOY, DO  [facility] [phone] (phone) [phone] (fax)  [facility]

## 2023-04-23 LAB — COMPREHENSIVE METABOLIC PANEL
ALT: 14 [IU]/L (ref 0–32)
AST: 20 [IU]/L (ref 0–40)
Albumin: 4.3 g/dL (ref 3.9–4.9)
Alkaline Phosphatase: 74 [IU]/L (ref 44–121)
BUN/Creatinine Ratio: 20 (ref 12–28)
BUN: 16 mg/dL (ref 8–27)
Bilirubin Total: 0.4 mg/dL (ref 0.0–1.2)
CO2: 27 mmol/L (ref 20–29)
Calcium: 9.5 mg/dL (ref 8.7–10.3)
Chloride: 102 mmol/L (ref 96–106)
Creatinine, Ser: 0.81 mg/dL (ref 0.57–1.00)
Globulin, Total: 2.8 g/dL (ref 1.5–4.5)
Glucose: 95 mg/dL (ref 70–99)
Potassium: 4.7 mmol/L (ref 3.5–5.2)
Sodium: 143 mmol/L (ref 134–144)
Total Protein: 7.1 g/dL (ref 6.0–8.5)
eGFR: 80 mL/min/{1.73_m2} (ref >59)

## 2023-04-23 LAB — MICROALBUMIN / CREATININE URINE RATIO
Creatinine, Urine: 44.4 mg/dL
Microalb/Creat Ratio: 11 mg/g{creat} (ref 0–29)
Microalbumin, Urine: 4.9 ug/mL

## 2023-04-23 LAB — LIPID PANEL
Chol/HDL Ratio: 4.3 {ratio} (ref 0.0–4.4)
Cholesterol, Total: 205 mg/dL — ABNORMAL HIGH (ref 100–199)
HDL: 48 mg/dL (ref >39)
LDL Chol Calc (NIH): 136 mg/dL — ABNORMAL HIGH (ref 0–99)
Triglycerides: 114 mg/dL (ref 0–149)
VLDL Cholesterol Cal: 21 mg/dL (ref 5–40)

## 2023-04-28 ENCOUNTER — Encounter: Payer: Self-pay | Admitting: Family Medicine

## 2023-04-29 ENCOUNTER — Ambulatory Visit
Admission: RE | Admit: 2023-04-29 | Discharge: 2023-04-29 | Disposition: A | Discharge status: Home / Self Care | Payer: Medicare PPO | Source: Ambulatory Visit | Attending: Family Medicine | Admitting: Family Medicine

## 2023-04-29 DIAGNOSIS — Z1231 Encounter for screening mammogram for malignant neoplasm of breast: Secondary | ICD-10-CM | POA: Insufficient documentation

## 2023-04-29 DIAGNOSIS — M8580 Other specified disorders of bone density and structure, unspecified site: Secondary | ICD-10-CM

## 2023-04-29 DIAGNOSIS — M810 Age-related osteoporosis without current pathological fracture: Secondary | ICD-10-CM | POA: Diagnosis not present

## 2023-04-29 DIAGNOSIS — Z780 Asymptomatic menopausal state: Secondary | ICD-10-CM | POA: Diagnosis not present

## 2023-04-29 DIAGNOSIS — Z1212 Encounter for screening for malignant neoplasm of rectum: Secondary | ICD-10-CM | POA: Diagnosis not present

## 2023-04-29 DIAGNOSIS — Z1211 Encounter for screening for malignant neoplasm of colon: Secondary | ICD-10-CM | POA: Diagnosis not present

## 2023-04-30 ENCOUNTER — Encounter: Payer: Self-pay | Admitting: Family Medicine

## 2023-04-30 DIAGNOSIS — J069 Acute upper respiratory infection, unspecified: Secondary | ICD-10-CM | POA: Insufficient documentation

## 2023-04-30 NOTE — Assessment & Plan Note (Signed)
Stable.  Continue carvedilol 12.5 mg bid, and entresto 24-26 mg bid

## 2023-04-30 NOTE — Assessment & Plan Note (Signed)
Physical exam overall unremarkable except as noted above. Routine lab work ordered as noted. Due for several vaccinations. Declined COVID-19 and shingles vaccines. Overdue for tetanus vaccine, last received in 2012. Discussed importance of shingles vaccine and risks associated with shingles reactivation, including potential impact on vision, hearing, and swallowing if it occurs on the face. Explained that shingles vaccine is more effective if administered before age 67. Encouraged to consider vaccination and inform pharmacy if interested. - Administer tetanus, diphtheria, pertussis (Tdap) vaccine - Document COVID-19 and shingles vaccine as declined - Encourage consideration of shingles vaccine and inform pharmacy if interested

## 2023-04-30 NOTE — Assessment & Plan Note (Signed)
Currently on carvedilol and furosemide. Follows up with cardiologist every six months. Reports minimal leg swelling if furosemide is skipped. Discussed medication management and importance of adherence. - Continue carvedilol and furosemide - Follow up with cardiologist as scheduled

## 2023-04-30 NOTE — Assessment & Plan Note (Signed)
Reports flu-like symptoms starting Thursday evening with a low-grade fever (100.64F) lasting until Saturday morning. Symptoms include a productive cough and sinus congestion. History of seasonal allergies may be contributing. Using Mucinex but feels it is not effective. Advised to monitor symptoms for potential pneumonia. - Continue Mucinex (guaifenesin) for chest congestion - Ensure adequate hydration - Rest - Consider chest x-ray if symptoms worsen to rule out pneumonia

## 2023-04-30 NOTE — Assessment & Plan Note (Addendum)
Noted. Followed by cardiologist; defer to specialist management.  No acute concerns. Continue to monitor.

## 2023-05-05 LAB — COLOGUARD: COLOGUARD: NEGATIVE

## 2023-05-07 ENCOUNTER — Encounter: Payer: Self-pay | Admitting: Family Medicine

## 2023-05-07 ENCOUNTER — Telehealth: Payer: Medicare PPO | Admitting: Emergency Medicine

## 2023-05-07 DIAGNOSIS — H1032 Unspecified acute conjunctivitis, left eye: Secondary | ICD-10-CM

## 2023-05-07 MED ORDER — ERYTHROMYCIN 5 MG/GM OP OINT: 1.0000 | TOPICAL_OINTMENT | Freq: Three times a day (TID) | OPHTHALMIC | 0 refills | Status: AC

## 2023-05-07 NOTE — Progress Notes (Signed)
 Virtual Visit Consent   [name], you are scheduled for a virtual visit with a [facility] provider today. Just as with appointments in the office, your consent must be obtained to participate. Your consent will be active for this visit and any virtual visit you may have with one of our providers in the next 365 days. If you have a MyChart account, a copy of this consent can be sent to you electronically.  As this is a virtual visit, video technology does not allow for your provider to perform a traditional examination. This may limit your provider's ability to fully assess your condition. If your provider identifies any concerns that need to be evaluated in person or the need to arrange testing (such as labs, EKG, etc.), we will make arrangements to do so. Although advances in technology are sophisticated, we cannot ensure that it will always work on either your end or our end. If the connection with a video visit is poor, the visit may have to be switched to a telephone visit. With either a video or telephone visit, we are not always able to ensure that we have a secure connection.  By engaging in this virtual visit, you consent to the provision of healthcare and authorize for your insurance to be billed (if applicable) for the services provided during this visit. Depending on your insurance coverage, you may receive a charge related to this service.  I need to obtain your verbal consent now. Are you willing to proceed with your visit today? Vanessa Cohen has provided verbal consent on 05/07/2023 for a virtual visit (video or telephone). Roxy Horseman, PA-C  Date: 05/07/2023 11:40 AM   Virtual Visit via Video Note   I, Roxy Horseman, connected with  Vanessa Cohen  ([id number], 1955-05-26) on 05/07/23 at 11:30 AM EST by a video-enabled telemedicine application and verified that I am speaking with the correct person using two identifiers.  Location: Patient: Virtual Visit Location  Patient: Home Provider: Virtual Visit Location Provider: Home Office   I discussed the limitations of evaluation and management by telemedicine and the availability of in person appointments. The patient expressed understanding and agreed to proceed.    History of Present Illness: Vanessa Cohen is a 68 y.o. who identifies as a female who was assigned female at birth, and is being seen today for pink eye.  States that she woke up with "crusties" in the left eye.  She denies any involvement of the right eye.  She denies pain.  She denies vision changes, flashes, floaters, vision loss, or blurriness.  She wears glasses, but not contacts.  She denies any other symptoms.  HPI: HPI  Problems:  Patient Active Problem List   Diagnosis Date Noted   Upper respiratory infection, viral 04/30/2023   Chronic kidney disease, stage 2, mildly decreased GFR 04/22/2023   Heat rash 12/10/2022   Tick bite of thigh 07/16/2022   Annual physical exam 04/04/2021   Hypercholesteremia 11/25/2015   Cardiac pacemaker in situ 01/26/2013   Hypertension 08/26/2012   Chronic systolic CHF (congestive heart failure) (HCC) 08/26/2012   Allergic rhinitis 05/24/2009   Secondary cardiomyopathy (HCC) 01/02/2009   H/O malignant neoplasm of skin 03/16/2006   Osteopenia 04/11/2003    Allergies:  Allergies  Allergen Reactions   Sulfa Antibiotics    Medications:  Current Outpatient Medications:    erythromycin ophthalmic ointment, Place 1 Application into both eyes 3 (three) times daily., Disp: 3.5 g, Rfl: 0   azelastine (OPTIVAR) 0.05 %  ophthalmic solution, Place 1 drop into both eyes 2 (two) times daily. (Patient taking differently: Place 1 drop into both eyes 2 (two) times daily as needed.), Disp: 6 mL, Rfl: 12   carvedilol (COREG) 12.5 MG tablet, Take 6.25 mg by mouth 2 (two) times daily., Disp: , Rfl:    cetirizine (ZYRTEC) 10 MG tablet, Take 1 tablet (10 mg total) by mouth daily., Disp: 90 tablet, Rfl: 3    dutasteride (AVODART) 0.5 MG capsule, 0.5 mg daily., Disp: , Rfl:    ENTRESTO 24-26 MG, Take 1 tablet by mouth 2 (two) times daily., Disp: , Rfl:    fluticasone (FLONASE) 50 MCG/ACT nasal spray, SPRAY 2 SPRAY IN EACH NOSRTIL DAILY (Patient taking differently: 1 spray 2 (two) times daily as needed. SPRAY 2 SPRAY IN EACH NOSRTIL DAILY), Disp: 16 g, Rfl: 5   furosemide (LASIX) 20 MG tablet, Taking 10 mg every other day, Disp: , Rfl:    minoxidil (LONITEN) 2.5 MG tablet, Take 0.5 tablets (1.25 mg total) by mouth daily., Disp: 30 tablet, Rfl: 2   Multiple Vitamin (MULTIVITAMIN ADULT PO), Take 1 tablet by mouth daily., Disp: , Rfl:    nystatin-triamcinolone ointment (MYCOLOG), Apply 1 Application topically 2 (two) times daily., Disp: 30 g, Rfl: 0  Observations/Objective: Patient is well-developed, well-nourished in no acute distress.  Resting comfortably  at home.  Mild conjunctival erythema to the left eye, more noticeable medially, remaining eye exam over video appears normal, visible steamy cornea Head is normocephalic, atraumatic.  No labored breathing.  Speech is clear and coherent with logical content.  Patient is alert and oriented at baseline.    Assessment and Plan: 1. Acute conjunctivitis of left eye, unspecified acute conjunctivitis type (Primary)  Meds ordered this encounter  Medications   erythromycin ophthalmic ointment    Sig: Place 1 Application into both eyes 3 (three) times daily.    Dispense:  3.5 g    Refill:  0    Supervising Provider:   Merrilee Jansky [id number]   Patient with signs and symptoms concerning for conjunctivitis.  Will treat with romycin as above.  Will treat both eyes.  Patient to follow-up in 48 hours for persistent or sooner for new/worsening symptoms  Follow Up Instructions: I discussed the assessment and treatment plan with the patient. The patient was provided an opportunity to ask questions and all were answered. The patient agreed with the plan  and demonstrated an understanding of the instructions.  A copy of instructions were sent to the patient via MyChart unless otherwise noted below.     The patient was advised to call back or seek an in-person evaluation if the symptoms worsen or if the condition fails to improve as anticipated.    Roxy Horseman, PA-C

## 2023-05-07 NOTE — Patient Instructions (Signed)
  Ignacia Felling, thank you for joining Roxy Horseman, PA-C for today's virtual visit.  While this provider is not your primary care provider (PCP), if your PCP is located in our provider database this encounter information will be shared with them immediately following your visit.   A [facility] MyChart account gives you access to today's visit and all your visits, tests, and labs performed at [facility] " click here if you don't have a [facility] MyChart account or go to [email or web address]  Consent: (Patient) Vanessa Cohen provided verbal consent for this virtual visit at the beginning of the encounter.  Current Medications:  Current Outpatient Medications:    erythromycin ophthalmic ointment, Place 1 Application into both eyes 3 (three) times daily., Disp: 3.5 g, Rfl: 0   azelastine (OPTIVAR) 0.05 % ophthalmic solution, Place 1 drop into both eyes 2 (two) times daily. (Patient taking differently: Place 1 drop into both eyes 2 (two) times daily as needed.), Disp: 6 mL, Rfl: 12   carvedilol (COREG) 12.5 MG tablet, Take 6.25 mg by mouth 2 (two) times daily., Disp: , Rfl:    cetirizine (ZYRTEC) 10 MG tablet, Take 1 tablet (10 mg total) by mouth daily., Disp: 90 tablet, Rfl: 3   dutasteride (AVODART) 0.5 MG capsule, 0.5 mg daily., Disp: , Rfl:    ENTRESTO 24-26 MG, Take 1 tablet by mouth 2 (two) times daily., Disp: , Rfl:    fluticasone (FLONASE) 50 MCG/ACT nasal spray, SPRAY 2 SPRAY IN EACH NOSRTIL DAILY (Patient taking differently: 1 spray 2 (two) times daily as needed. SPRAY 2 SPRAY IN EACH NOSRTIL DAILY), Disp: 16 g, Rfl: 5   furosemide (LASIX) 20 MG tablet, Taking 10 mg every other day, Disp: , Rfl:    minoxidil (LONITEN) 2.5 MG tablet, Take 0.5 tablets (1.25 mg total) by mouth daily., Disp: 30 tablet, Rfl: 2   Multiple Vitamin (MULTIVITAMIN ADULT PO), Take 1 tablet by mouth daily., Disp: , Rfl:    nystatin-triamcinolone ointment (MYCOLOG), Apply 1 Application  topically 2 (two) times daily., Disp: 30 g, Rfl: 0   Medications ordered in this encounter:  Meds ordered this encounter  Medications   erythromycin ophthalmic ointment    Sig: Place 1 Application into both eyes 3 (three) times daily.    Dispense:  3.5 g    Refill:  0    Supervising Provider:   Merrilee Jansky [[id number]]     *If you need refills on other medications prior to your next appointment, please contact your pharmacy*  Follow-Up: Call back or seek an in-person evaluation if the symptoms worsen or if the condition fails to improve as anticipated.  [facility] [phone]  Other Instructions    If you have been instructed to have an in-person evaluation today at a local Urgent Care facility, please use the link below. It will take you to a list of all of our available [facility], including address, phone number and hours of operation. Please do not delay care.  [facility]  If you or a family member do not have a primary care provider, use the link below to schedule a visit and establish care. When you choose a [facility] primary care physician or advanced practice provider, you gain a long-term partner in health. Find a Primary Care Provider  Learn more about [facility] in-office and virtual care options: [facility] - Get Care Now

## 2023-05-14 ENCOUNTER — Other Ambulatory Visit: Payer: Self-pay | Admitting: Family Medicine

## 2023-05-14 ENCOUNTER — Encounter: Payer: Self-pay | Admitting: Family Medicine

## 2023-05-14 DIAGNOSIS — J301 Allergic rhinitis due to pollen: Secondary | ICD-10-CM

## 2023-05-14 MED ORDER — FLUTICASONE PROPIONATE 50 MCG/ACT NA SUSP
NASAL | 5 refills | Status: AC
Start: 2023-05-14 — End: ?

## 2023-06-21 ENCOUNTER — Other Ambulatory Visit: Payer: Self-pay | Admitting: Family Medicine

## 2023-06-21 ENCOUNTER — Ambulatory Visit: Payer: Self-pay

## 2023-06-21 DIAGNOSIS — Z9189 Other specified personal risk factors, not elsewhere classified: Secondary | ICD-10-CM

## 2023-06-21 DIAGNOSIS — W57XXXA Bitten or stung by nonvenomous insect and other nonvenomous arthropods, initial encounter: Secondary | ICD-10-CM

## 2023-06-21 MED ORDER — DOXYCYCLINE HYCLATE 100 MG PO TABS: 200.0000 mg | ORAL_TABLET | Freq: Once | ORAL | 0 refills | Status: AC

## 2023-06-21 NOTE — Telephone Encounter (Signed)
 Copied From CRM [id number]. Reason for Triage: the patient has noticed a tick on their neck this Saturday 06/19/23   The patient has experienced this previously and would like to be prescribed doxycycline to ensure that there are no issues as a result of the insect bite   Chief Complaint: Tick bite Symptoms: Redness, swelling, tenderness Frequency: Saturday morning Pertinent Negatives: Patient denies fever Disposition: [] ED /[] Urgent Care (no appt availability in office) / [x] Appointment(In office/virtual)/ []  [facility]/ [] Home Care/ [x] Refused Recommended Disposition /[] [facility]/ []  Follow-up with PCP Additional Notes: Patient called in to report a tick bite. Patient stated she noticed the tick on the right side of her neck Saturday morning. Patient removed tick. Patient stated the area is red, tender and swollen. Patient stated the area has a head that looks like it can be popped and drained. Patient has been cleaning area and applying antibiotic ointment for maintenance. Patient denied fever and headache. Advised patient to see provider within 3 days, per protocol. Patient declined and requested doxycyline to be prescribed. Patient stated she has been prescribed this medication without an appointment for tick bites in the past. Advised that I would route conversation to clinic for their discretion on prescribing medication. Advised patient to call back if symptoms worsen. Patient complied.   Reason for Disposition  [1] Scab is present AND [2] it drains pus or increases in size AND [3] not improved after applying antibiotic ointment for 2 days    Patient stated swollen area has a head that looks like it can be popped and drained. Patient has been applying antibiotic ointment since Saturday.  Answer Assessment - Initial Assessment Questions 1. ATTACHED:  "Is the tick still on the skin?"  (e.g., yes, no, unsure)     No 2. ONSET - TICK STILL ATTACHED:  "How long do you think  the tick has been on your skin?" (e.g., hours, days, unsure)  Note:  Is there a recent activity (camping, hiking) where the caller may have been exposed?     N/A 3. ONSET - TICK NOT STILL ATTACHED: "If the tick has been removed, how long do you think the tick was attached before you removed it?" (e.g., 5 hours, 2 days). "When was this?"     Removed Saturday morning 4. LOCATION: "Where is the tick bite located?" (e.g., arm, leg)     Right side of neck 5. TYPE of TICK: "Is it a wood tick or a deer tick?" (e.g., deer tick, wood tick; unsure)     Unsure 6. SIZE of TICK: "How big is the tick?" (e.g., size of poppy seed, apple seed, watermelon seed; unsure) Note: Deer ticks can be the size of a poppy seed (nymph) or an apple seed (adult).       Size of pen tip 7. ENGORGED: "Did the tick look flat or engorged (full, swollen)?" (e.g., flat, engorged; unsure)     N/A 8. OTHER SYMPTOMS: "Do you have any other symptoms?" (e.g., fever, rash, redness at bite area, red ring around bite)     Swelling, tender to touch, redness, raised bump, denies fever, states swollen area has a head that looks like it can be popped  Protocols used: Tick Bite-A-AH

## 2023-06-21 NOTE — Telephone Encounter (Signed)
 Patient advised and verbalized understanding

## 2023-07-09 DIAGNOSIS — L814 Other melanin hyperpigmentation: Secondary | ICD-10-CM | POA: Diagnosis not present

## 2023-07-09 DIAGNOSIS — L6612 Frontal fibrosing alopecia: Secondary | ICD-10-CM | POA: Diagnosis not present

## 2023-07-09 DIAGNOSIS — L910 Hypertrophic scar: Secondary | ICD-10-CM | POA: Diagnosis not present

## 2023-07-09 DIAGNOSIS — S1086XA Insect bite of other specified part of neck, initial encounter: Secondary | ICD-10-CM | POA: Diagnosis not present

## 2023-07-09 DIAGNOSIS — L821 Other seborrheic keratosis: Secondary | ICD-10-CM | POA: Diagnosis not present

## 2023-07-09 DIAGNOSIS — D485 Neoplasm of uncertain behavior of skin: Secondary | ICD-10-CM | POA: Diagnosis not present

## 2023-07-09 DIAGNOSIS — L648 Other androgenic alopecia: Secondary | ICD-10-CM | POA: Diagnosis not present

## 2023-07-09 DIAGNOSIS — S0006XA Insect bite (nonvenomous) of scalp, initial encounter: Secondary | ICD-10-CM | POA: Diagnosis not present

## 2023-07-09 DIAGNOSIS — Z86006 Personal history of melanoma in-situ: Secondary | ICD-10-CM | POA: Diagnosis not present

## 2023-07-09 DIAGNOSIS — L570 Actinic keratosis: Secondary | ICD-10-CM | POA: Diagnosis not present

## 2023-10-21 ENCOUNTER — Ambulatory Visit: Payer: Medicare PPO | Admitting: Family Medicine

## 2023-10-21 ENCOUNTER — Encounter: Payer: Self-pay | Admitting: Family Medicine

## 2023-10-21 VITALS — BP 124/70 | HR 68 | Ht 67.5 in | Wt 154.2 lb

## 2023-10-21 DIAGNOSIS — Z7189 Other specified counseling: Secondary | ICD-10-CM | POA: Diagnosis not present

## 2023-10-21 DIAGNOSIS — M810 Age-related osteoporosis without current pathological fracture: Secondary | ICD-10-CM

## 2023-10-21 DIAGNOSIS — Z0001 Encounter for general adult medical examination with abnormal findings: Secondary | ICD-10-CM | POA: Diagnosis not present

## 2023-10-21 DIAGNOSIS — Z0000 Encounter for general adult medical examination without abnormal findings: Secondary | ICD-10-CM

## 2023-10-21 DIAGNOSIS — J301 Allergic rhinitis due to pollen: Secondary | ICD-10-CM

## 2023-10-21 DIAGNOSIS — L659 Nonscarring hair loss, unspecified: Secondary | ICD-10-CM

## 2023-10-21 MED ORDER — MINOXIDIL 2.5 MG PO TABS
1.2500 mg | ORAL_TABLET | Freq: Every day | ORAL | 3 refills | Status: AC
Start: 2023-10-21 — End: ?

## 2023-10-21 NOTE — Progress Notes (Signed)
 Annual Wellness Visit     Patient: Vanessa Cohen, Female    DOB: 1956/01/26, 68 y.o.   MRN: [id number] Visit Date: 10/21/2023  Today's Provider: LAURAINE LOISE BUOY, DO   Chief Complaint  Patient presents with   Medical Management of Chronic Issues    Patient is present for 6 month follow-up. She was last seen 04/22/23 with labs completed. She reports no symptoms with no concerns to report today.    Subjective    Vanessa Cohen is a 68 y.o. female who presents today for her Annual Wellness Visit.   HPI Vanessa Cohen is a 68 year old female with ADD and osteoporosis who presents for a Medicare annual wellness visit.  She has not experienced any significant health issues recently and expressed confusion about the need for a follow-up appointment. She recalls a fall about three and a half years ago due to carrying multiple items and slipping on wet steps, but has not had any falls since then.  She has seasonal allergies primarily triggered by tree pollen and crepe myrtles, with a break in symptoms between May and the recent months. She takes Zyrtec  and Flonase  as needed, and uses eye drops occasionally. Zyrtec  effectively controls her symptoms.  She is currently taking minoxidil , prescribed by her dermatologist for hair loss, at a dose of half a tablet every evening. She started at a quarter tablet and increased to half a tablet after consulting with her dermatologist. She also takes magnesium at night, though she is unsure of the exact dosage.  She engages in regular physical activity, walking her dogs for at least a mile daily and doing yard work. She maintains a generally good diet, though she admits to eating chips occasionally. She tends to eat dinner late, between 8:30 and 9:00 PM, due to working outside in the evenings during summer. She sleeps well, usually going to bed between 10:00 and 10:30 PM.  She recalls a past fracture and reports that a recent bone density scan showed  osteoporosis. She takes vitamin D3 with K but not calcium.  She declined the shingles, COVID, and flu vaccines, citing past experiences where the flu vaccine did not cover the strain she contracted. She is due for a tetanus vaccine, last received in 2022.  She has been diagnosed with ADD, which she believes affects her memory.     Medications: Outpatient Medications Prior to Visit  Medication Sig   azelastine  (OPTIVAR ) 0.05 % ophthalmic solution Place 1 drop into both eyes 2 (two) times daily.   carvedilol (COREG) 12.5 MG tablet Take 6.25 mg by mouth 2 (two) times daily.   cetirizine  (ZYRTEC ) 10 MG tablet Take 1 tablet (10 mg total) by mouth daily.   dutasteride (AVODART) 0.5 MG capsule 0.5 mg daily.   ENTRESTO 24-26 MG Take 1 tablet by mouth 2 (two) times daily.   erythromycin  ophthalmic ointment Place 1 Application into both eyes 3 (three) times daily.   fluticasone  (FLONASE ) 50 MCG/ACT nasal spray SPRAY 2 SPRAY IN EACH NOSRTIL DAILY   furosemide (LASIX) 20 MG tablet Taking 10 mg every other day   MAGNESIUM PO Take 3 tablets by mouth at bedtime.   Multiple Vitamin (MULTIVITAMIN ADULT PO) Take 1 tablet by mouth daily.   nystatin -triamcinolone  ointment (MYCOLOG) Apply 1 Application topically 2 (two) times daily.   [DISCONTINUED] minoxidil  (LONITEN ) 2.5 MG tablet Take 0.5 tablets (1.25 mg total) by mouth daily.   No facility-administered medications prior to visit.    Allergies  Allergen Reactions   Sulfa Antibiotics     Patient Care Team: Arieon Scalzo N, DO as PCP - General (Family Medicine)        Objective    Vitals: BP 124/70 (BP Location: Left Arm, Patient Position: Sitting, Cuff Size: Normal)   Pulse 68   Ht 5' 7.5 (1.715 m)   Wt 154 lb 3.2 oz (69.9 kg)   SpO2 100%   BMI 23.79 kg/m      Physical Exam Vitals and nursing note reviewed.  Constitutional:      General: She is not in acute distress.    Appearance: Normal appearance.  HENT:     Head:  Normocephalic and atraumatic.  Eyes:     General: No scleral icterus.    Conjunctiva/sclera: Conjunctivae normal.  Cardiovascular:     Rate and Rhythm: Normal rate.  Pulmonary:     Effort: Pulmonary effort is normal.  Neurological:     Mental Status: She is alert and oriented to person, place, and time. Mental status is at baseline.  Psychiatric:        Mood and Affect: Mood normal.        Behavior: Behavior normal.      Most recent functional status assessment:    10/21/2023    8:22 AM  In your present state of health, do you have any difficulty performing the following activities:  Hearing? 0  Vision? 0  Difficulty concentrating or making decisions? 0  Walking or climbing stairs? 0  Dressing or bathing? 0  Doing errands, shopping? 0   Most recent fall risk assessment:    10/21/2023    8:21 AM  Fall Risk   Falls in the past year? 0  Number falls in past yr: 0  Injury with Fall? 0  Risk for fall due to : No Fall Risks    Most recent depression screenings:    10/21/2023    8:21 AM 04/22/2023   10:26 AM  PHQ 2/9 Scores  PHQ - 2 Score 0 0  PHQ- 9 Score 0    Most recent cognitive screening:    10/21/2023    8:22 AM  6CIT Screen  What Year? 0 points  What month? 0 points  What time? 0 points  Count back from 20 0 points  Months in reverse 0 points  Repeat phrase 0 points  Total Score 0 points   Most recent Audit-C alcohol use screening    04/18/2023   12:11 PM  Alcohol Use Disorder Test (AUDIT)  1. How often do you have a drink containing alcohol? 1  2. How many drinks containing alcohol do you have on a typical day when you are drinking? 0  3. How often do you have six or more drinks on one occasion? 0  AUDIT-C Score 1      Patient-reported   A score of 3 or more in women, and 4 or more in men indicates increased risk for alcohol abuse, EXCEPT if all of the points are from question 1   No results found for any visits on 10/21/23.  Assessment & Plan      Annual wellness visit done today including the all of the following: Reviewed patient's Family Medical History Reviewed and updated list of patient's medical providers Assessment of cognitive impairment was done Assessed patient's functional ability Established a written schedule for health screening services Health Risk Assessent Completed and Reviewed Patient is not currently on any opioid medications.  Exercise Activities and  Dietary recommendations  Goals   None     Immunization History  Administered Date(s) Administered   Hepatitis B 05/30/1992, 07/04/1992, 01/02/1993   Influenza Split 01/03/2013   Influenza,inj,Quad PF,6+ Mos 12/02/2018   Influenza-Unspecified 12/02/2018   MMR 04/18/2010   PPD Test 02/22/2007, 01/29/2014   Pneumococcal Polysaccharide-23 04/08/2013   Td 03/27/2005   Tdap 05/05/2010    Health Maintenance  Topic Date Due   DTaP/Tdap/Td (3 - Td or Tdap) 05/05/2020   COVID-19 Vaccine (1) 11/15/2023 (Originally 09/01/1960)   Pneumococcal Vaccine: 50+ Years (2 of 2 - PCV) 04/21/2024 (Originally 04/08/2014)   INFLUENZA VACCINE  06/13/2024 (Originally 10/15/2023)   Zoster Vaccines- Shingrix (1 of 2) 10/20/2024 (Originally 09/02/1975)   MAMMOGRAM  04/28/2024   Medicare Annual Wellness (AWV)  10/20/2024   Fecal DNA (Cologuard)  04/28/2026   DEXA SCAN  Completed   Hepatitis C Screening  Completed   HPV VACCINES  Aged Out   Meningococcal B Vaccine  Aged Out   Pneumococcal Vaccine  Discontinued   Hepatitis B Vaccines 19-59 Average Risk  Discontinued   Colonoscopy  Discontinued     Discussed health benefits of physical activity, and encouraged her to engage in regular exercise appropriate for her age and condition.    Medicare annual wellness visit, subsequent  Hair loss -     Minoxidil ; Take 0.5 tablets (1.25 mg total) by mouth daily.  Dispense: 45 tablet; Refill: 3  Seasonal allergic rhinitis due to pollen  Osteoporosis of lumbar spine  Goals of  care, counseling/discussion      Medicare annual wellness visit, subsequent Active lifestyle. Declines flu, shingles, and COVID vaccines. Due for tetanus vaccine. No advanced directive. - Schedule Medicare annual wellness visit. - Update tetanus vaccine at pharmacy. - Encourage continued physical activity and hydration. - Discuss advanced directive options with daughter.  Allergic Rhinitis Chronic allergic rhinitis exacerbated by tree pollen and crepe myrtles. Symptoms controlled with Zyrtec , Flonase , and eye drops. Xyzal is an alternative with potential dryness. - Continue Zyrtec  as needed. - Continue Flonase  as needed. - Use eye drops as needed. - Consider Xyzal if symptoms worsen, noting potential side effect of dryness.  Osteoporosis Recent bone density scan confirms osteoporosis, with lumbar spine at L1-L2 demonstrating a T-score of -3.0.. History of falls and dental issues. Concerned about bisphosphonates and oral surgery complications. Taking vitamin D but not calcium. - Continue vitamin D supplementation. - Start calcium supplementation, 1200 mg daily. - Continue aerobic exercise. - Reconsider possibility of medication to address osteoporosis once dental concerns have been addressed.  Goals of Care Lacks advanced directive. Discussed importance and ensuring family awareness. Daughter Mitzie is closest family member but uninformed of wishes, per patient. - Discuss advanced directive with daughter. - Consider completing an advanced directive form and getting it notarized.    Return in about 6 months (around 04/22/2024) for CPE.     I discussed the assessment and treatment plan with the patient  The patient was provided an opportunity to ask questions and all were answered. The patient agreed with the plan and demonstrated an understanding of the instructions.   The patient was advised to call back or seek an in-person evaluation if the symptoms worsen or if the condition  fails to improve as anticipated.    LAURAINE LOISE BUOY, DO  [facility] [phone] (phone) [phone] (fax)  [facility]

## 2023-10-21 NOTE — Patient Instructions (Addendum)
 Recommend updating Tdap at the pharmacy. Your last one was in 2022, and we recommend you update it every 10 years.  Recommend taking 1200 mg calcium and 2000 units vitamin D.   You can also use the [employer] form as an advanced directive.

## 2023-10-22 ENCOUNTER — Telehealth: Payer: Self-pay | Admitting: Family Medicine

## 2023-10-22 NOTE — Telephone Encounter (Signed)
 [facility] faxed refill request for the following medications:   azelastine  (OPTIVAR ) 0.05 % ophthalmic solution     Please advise.

## 2023-11-01 ENCOUNTER — Encounter: Payer: Self-pay | Admitting: Family Medicine

## 2023-11-03 ENCOUNTER — Other Ambulatory Visit: Payer: Self-pay

## 2023-11-03 DIAGNOSIS — J301 Allergic rhinitis due to pollen: Secondary | ICD-10-CM

## 2023-11-03 MED ORDER — AZELASTINE HCL 0.05 % OP SOLN
1.0000 [drp] | Freq: Two times a day (BID) | OPHTHALMIC | 12 refills | Status: AC
Start: 2023-11-03 — End: ?

## 2023-11-03 NOTE — Telephone Encounter (Signed)
 LOV 10/21/23 NOV 2/6 26 LRF 07/16/22 6 ml x 12

## 2023-11-03 NOTE — Telephone Encounter (Signed)
Converted to refill ?

## 2023-11-03 NOTE — Telephone Encounter (Signed)
 Received another refill request for this medication from [facility].  Please advise

## 2023-12-02 DIAGNOSIS — I429 Cardiomyopathy, unspecified: Secondary | ICD-10-CM | POA: Diagnosis not present

## 2023-12-02 DIAGNOSIS — Z950 Presence of cardiac pacemaker: Secondary | ICD-10-CM | POA: Diagnosis not present

## 2023-12-02 DIAGNOSIS — I5022 Chronic systolic (congestive) heart failure: Secondary | ICD-10-CM | POA: Diagnosis not present

## 2023-12-02 DIAGNOSIS — I10 Essential (primary) hypertension: Secondary | ICD-10-CM | POA: Diagnosis not present

## 2023-12-23 DIAGNOSIS — E785 Hyperlipidemia, unspecified: Secondary | ICD-10-CM | POA: Diagnosis not present

## 2023-12-23 DIAGNOSIS — I429 Cardiomyopathy, unspecified: Secondary | ICD-10-CM | POA: Diagnosis not present

## 2023-12-23 DIAGNOSIS — L659 Nonscarring hair loss, unspecified: Secondary | ICD-10-CM | POA: Diagnosis not present

## 2023-12-23 DIAGNOSIS — I130 Hypertensive heart and chronic kidney disease with heart failure and stage 1 through stage 4 chronic kidney disease, or unspecified chronic kidney disease: Secondary | ICD-10-CM | POA: Diagnosis not present

## 2023-12-23 DIAGNOSIS — Z8249 Family history of ischemic heart disease and other diseases of the circulatory system: Secondary | ICD-10-CM | POA: Diagnosis not present

## 2023-12-23 DIAGNOSIS — J301 Allergic rhinitis due to pollen: Secondary | ICD-10-CM | POA: Diagnosis not present

## 2023-12-23 DIAGNOSIS — M810 Age-related osteoporosis without current pathological fracture: Secondary | ICD-10-CM | POA: Diagnosis not present

## 2023-12-23 DIAGNOSIS — I509 Heart failure, unspecified: Secondary | ICD-10-CM | POA: Diagnosis not present

## 2023-12-23 DIAGNOSIS — Z809 Family history of malignant neoplasm, unspecified: Secondary | ICD-10-CM | POA: Diagnosis not present

## 2024-01-28 DIAGNOSIS — D1801 Hemangioma of skin and subcutaneous tissue: Secondary | ICD-10-CM | POA: Diagnosis not present

## 2024-01-28 DIAGNOSIS — C44319 Basal cell carcinoma of skin of other parts of face: Secondary | ICD-10-CM | POA: Diagnosis not present

## 2024-01-28 DIAGNOSIS — L821 Other seborrheic keratosis: Secondary | ICD-10-CM | POA: Diagnosis not present

## 2024-01-28 DIAGNOSIS — L6612 Frontal fibrosing alopecia: Secondary | ICD-10-CM | POA: Diagnosis not present

## 2024-01-28 DIAGNOSIS — L814 Other melanin hyperpigmentation: Secondary | ICD-10-CM | POA: Diagnosis not present

## 2024-01-28 DIAGNOSIS — L648 Other androgenic alopecia: Secondary | ICD-10-CM | POA: Diagnosis not present

## 2024-01-28 DIAGNOSIS — Z85828 Personal history of other malignant neoplasm of skin: Secondary | ICD-10-CM | POA: Diagnosis not present

## 2024-01-28 DIAGNOSIS — Z86006 Personal history of melanoma in-situ: Secondary | ICD-10-CM | POA: Diagnosis not present

## 2024-01-28 DIAGNOSIS — D485 Neoplasm of uncertain behavior of skin: Secondary | ICD-10-CM | POA: Diagnosis not present

## 2024-01-28 DIAGNOSIS — C44619 Basal cell carcinoma of skin of left upper limb, including shoulder: Secondary | ICD-10-CM | POA: Diagnosis not present

## 2024-01-28 DIAGNOSIS — Z08 Encounter for follow-up examination after completed treatment for malignant neoplasm: Secondary | ICD-10-CM | POA: Diagnosis not present

## 2024-01-28 DIAGNOSIS — L570 Actinic keratosis: Secondary | ICD-10-CM | POA: Diagnosis not present

## 2024-04-21 ENCOUNTER — Encounter: Admitting: Family Medicine

## 2024-05-19 ENCOUNTER — Encounter: Admitting: Family Medicine
